# Patient Record
Sex: Male | Born: 1980
Health system: Southern US, Community
[De-identification: ages and names within clinical notes are randomized; demographics above are authoritative.]

## PROBLEM LIST (undated history)

## (undated) HISTORY — PX: INNER EAR SURGERY: SHX679

## (undated) HISTORY — PX: TYMPANOSTOMY TUBE PLACEMENT: SHX32

---

## 2015-09-17 ENCOUNTER — Emergency Department (INDEPENDENT_AMBULATORY_CARE_PROVIDER_SITE_OTHER)
Admission: EM | Admit: 2015-09-17 | Discharge: 2015-09-17 | Disposition: A | Discharge status: Home / Self Care | Payer: Managed Care, Other (non HMO) | Source: Home / Self Care | Attending: Family Medicine | Admitting: Family Medicine

## 2015-09-17 ENCOUNTER — Encounter: Payer: Self-pay | Admitting: Emergency Medicine

## 2015-09-17 DIAGNOSIS — J0120 Acute ethmoidal sinusitis, unspecified: Secondary | ICD-10-CM

## 2015-09-17 MED ORDER — AZITHROMYCIN 250 MG PO TABS: ORAL_TABLET | ORAL | Status: DC

## 2015-09-17 NOTE — ED Provider Notes (Signed)
CSN: [id number]     Arrival date & time 09/17/15  1352 History   First MD Initiated Contact with Patient 09/17/15 1405     Chief Complaint  Patient presents with  . Nose Problem      HPI Comments: Patient complains of soreness behind the bridge of his nose for about 5 days.  He has had mild nasal congestion but no cold symptoms, and feels well otherwise.  The history is provided by the patient.    History reviewed. No pertinent past medical history. History reviewed. No pertinent past surgical history. Family History  Problem Relation Age of Onset  . Parkinson's disease Father    Social History  Substance Use Topics  . Smoking status: Current Every Day Smoker -- 1.00 packs/day    Types: Cigarettes  . Smokeless tobacco: None  . Alcohol Use: Yes    Review of Systems No sore throat No cough No pleuritic pain No wheezing + nasal congestion ? post-nasal drainage + sinus pain/pressure No itchy/red eyes No earache No hemoptysis No SOB ? fever  No nausea No vomiting No abdominal pain No diarrhea No urinary symptoms No skin rash No fatigue No myalgias No headache Used OTC meds without relief  Allergies  Review of patient's allergies indicates no known allergies.  Home Medications   Prior to Admission medications   Medication Sig Start Date End Date Taking? Authorizing Provider  sodium chloride (OCEAN) 0.65 % SOLN nasal spray Place 1 spray into both nostrils as needed for congestion.   Yes Historical Provider, MD  azithromycin (ZITHROMAX Z-PAK) 250 MG tablet Take 2 tabs today; then begin one tab once daily for 4 more days. 09/17/15   Lattie Haw, MD   Meds Ordered and Administered this Visit  Medications - No data to display  BP 119/76 mmHg  Pulse 68  Temp(Src) 97.9 F (36.6 C) (Oral)  Ht  (1.702 m)  Wt 153 lb (69.4 kg)  BMI 23.96 kg/m2  SpO2 97% No data found.   Physical Exam Nursing notes and Vital Signs reviewed. Appearance:  Patient appears  stated age, and in no acute distress Eyes:  Pupils are equal, round, and reactive to light and accomodation.  Extraocular movement is intact.  Conjunctivae are not inflamed  Ears:  Canals normal.  Tympanic membranes normal.  Nose:  Mildly congested turbinates.  There is tenderness to palpation over the bridge of nose, although there is no erythema, swelling, or warmth.  Pharynx:  Normal Neck:  Supple.  No adenopathy Lungs:  Clear to auscultation.  Breath sounds are equal.  Moving air well. Heart:  Regular rate and rhythm without murmurs, rubs, or gallops.  Abdomen:  Nontender without masses or hepatosplenomegaly.  Bowel sounds are present.  No CVA or flank tenderness.  Extremities:  No edema.  Skin:  No rash present.   ED Course  Procedures  none  MDM   1. Acute ethmoidal sinusitis, recurrence not specified     Patient declines sinus films.  Begin Z-pack.  Take Pseudoephedrine ( , one or two every 4 to 6 hours) for sinus congestion.  Get adequate rest.   May use Afrin nasal spray (or generic oxymetazoline) twice daily for about 5 days and then discontinue.  Also recommend using saline nasal spray several times daily and saline nasal irrigation (AYR is a common brand).  Use Flonase nasal spray each morning after using Afrin nasal spray and saline nasal irrigation. Stop all antihistamines for now, and other non-prescription cough/cold  preparations.   Lattie HawStephen A Helen Winterhalter, MD 09/20/15 [phone]

## 2015-09-17 NOTE — Discharge Instructions (Signed)
Take Pseudoephedrine (30mg , one or two every 4 to 6 hours) for sinus congestion.  Get adequate rest.   May use Afrin nasal spray (or generic oxymetazoline) twice daily for about 5 days and then discontinue.  Also recommend using saline nasal spray several times daily and saline nasal irrigation (AYR is a common brand).  Use Flonase nasal spray each morning after using Afrin nasal spray and saline nasal irrigation. Stop all antihistamines for now, and other non-prescription cough/cold preparations.    Sinus Rinse WHAT IS A SINUS RINSE? A sinus rinse is a simple home treatment that is used to rinse your sinuses with a sterile mixture of salt and water (saline solution). Sinuses are air-filled spaces in your skull behind the bones of your face and forehead that open into your nasal cavity. You will use the following:  Saline solution.  Neti pot or spray bottle. This releases the saline solution into your nose and through your sinuses. Neti pots and spray bottles can be purchased at [facility] local pharmacy, a health food store, or online. WHEN WOULD I DO A SINUS RINSE? A sinus rinse can help to clear mucus, dirt, dust, or pollen from the nasal cavity. You may do a sinus rinse when you have a cold, a virus, nasal allergy symptoms, a sinus infection, or stuffiness in the nose or sinuses. If you are considering a sinus rinse:  Ask your child's health care provider before performing a sinus rinse on your child.  Do not do a sinus rinse if you have had ear or nasal surgery, ear infection, or blocked ears. HOW DO I DO A SINUS RINSE?  Wash your hands.  Disinfect your device according to the directions provided and then dry it.  Use the solution that comes with your device or one that is sold separately in stores. Follow the mixing directions on the package.  Fill your device with the amount of saline solution as directed by the device instructions.  Stand over a sink and tilt your head sideways over  the sink.  Place the spout of the device in your upper nostril (the one closer to the ceiling).  Gently pour or squeeze the saline solution into the nasal cavity. The liquid should drain to the lower nostril if you are not overly congested.  Gently blow your nose. Blowing too hard may cause ear pain.  Repeat in the other nostril.  Clean and rinse your device with clean water and then air-dry it. ARE THERE RISKS OF A SINUS RINSE?  Sinus rinse is generally very safe and effective. However, there are a few risks, which include:   A burning sensation in the sinuses. This may happen if you do not make the saline solution as directed. Make sure to follow all directions when making the saline solution.  Infection from contaminated water. This is rare, but possible.  Nasal irritation.   This information is not intended to replace advice given to you by your health care provider. Make sure you discuss any questions you have with your health care provider.   Document Released: 12/17/2013 Document Reviewed: 12/17/2013 Elsevier Interactive Patient Education [employer]2016 [employer] Inc.

## 2015-09-17 NOTE — ED Notes (Signed)
Pain in the bridge of nose x 6 days, slight runny nose, bridge of nose hurts to the touch. Used Saline spray and it burned. Slight pressure, like bit of headache one day.

## 2015-11-12 ENCOUNTER — Emergency Department: Admission: EM | Admit: 2015-11-12 | Discharge: 2015-11-12 | Discharge status: Absent without leave | Payer: Self-pay

## 2017-07-17 ENCOUNTER — Other Ambulatory Visit: Payer: Self-pay

## 2017-07-17 ENCOUNTER — Emergency Department (INDEPENDENT_AMBULATORY_CARE_PROVIDER_SITE_OTHER)
Admission: EM | Admit: 2017-07-17 | Discharge: 2017-07-17 | Disposition: A | Discharge status: Home / Self Care | Payer: Managed Care, Other (non HMO) | Source: Home / Self Care | Attending: Family Medicine | Admitting: Family Medicine

## 2017-07-17 DIAGNOSIS — J209 Acute bronchitis, unspecified: Secondary | ICD-10-CM | POA: Diagnosis not present

## 2017-07-17 LAB — POCT RAPID STREP A (OFFICE): RAPID STREP A SCREEN: NEGATIVE

## 2017-07-17 MED ORDER — AZITHROMYCIN 250 MG PO TABS: ORAL_TABLET | ORAL | 0 refills | Status: DC

## 2017-07-17 NOTE — Discharge Instructions (Signed)
Take plain guaifenesin (600mg  or 1200mg  extended release tabs such as Mucinex) twice daily, with plenty of water, for cough and congestion.  May add Pseudoephedrine (30mg , one or two every 4 to 6 hours) for sinus congestion.  Get adequate rest.   May use Afrin nasal spray (or generic oxymetazoline) each morning for about 5 days and then discontinue.  Also recommend using saline nasal spray several times daily and saline nasal irrigation (AYR is a common brand).   Try warm salt water gargles for sore throat.  Stop all antihistamines for now, and other non-prescription cough/cold preparations. May take Ibuprofen 200mg , 4 tabs every 8 hours with food for body aches, headache, etc.. May take Delsym Cough Suppressant at bedtime for nighttime cough.

## 2017-07-17 NOTE — ED Provider Notes (Signed)
[facility]    CSN: [id number] Arrival date & time: 07/17/17  1318     History   Chief Complaint Chief Complaint  Patient presents with  . Sore Throat  . Generalized Body Aches  . Chills    HPI Grant Gilbert is a 37 y.o. male.   Patient reports that he had a flu-like illness two weeks ago and improved, but has a persistent cough.  Yesterday he developed sore throat, chills, fatigue, myalgias, fever to 99.5, and increased sinus congestion.  He denies pleuritic pain but often coughs until he gags.  He states that his daughter has had a strep infection.   The history is provided by the patient.    History reviewed. No pertinent past medical history.  There are no active problems to display for this patient.   History reviewed. No pertinent surgical history.     Home Medications    Prior to Admission medications   Medication Sig Start Date End Date Taking? Authorizing Provider  azithromycin (ZITHROMAX Z-PAK) 250 MG tablet Take 2 tabs today; then begin one tab once daily for 4 more days. 07/17/17   Lattie HawBeese, Hinley Brimage A, MD  sodium chloride (OCEAN) 0.65 % SOLN nasal spray Place 1 spray into both nostrils as needed for congestion.    [provider]    Family History Family History  Problem Relation Age of Onset  . Parkinson's disease Father     Social History Social History   Tobacco Use  . Smoking status: Current Every Day Smoker    Packs/day: 1.00    Types: Cigarettes  . Smokeless tobacco: Never Used  Substance Use Topics  . Alcohol use: Yes  . Drug use: No     Allergies   Patient has no known allergies.   Review of Systems Review of Systems No sore throat + cough No pleuritic pain No wheezing + nasal congestion + post-nasal drainage No sinus pain/pressure No itchy/red eyes No earache No hemoptysis No SOB + fever, + chills No nausea No vomiting No abdominal pain No diarrhea No urinary symptoms No skin rash + fatigue +  myalgias No headache Used OTC meds without relief   Physical Exam Triage Vital Signs ED Triage Vitals  Enc Vitals Group     BP 07/17/17 1519 135/80     Pulse Rate 07/17/17 1519 90     Resp --      Temp 07/17/17 1519 98.5 F (36.9 C)     Temp Source 07/17/17 1519 Oral     SpO2 07/17/17 1519 96 %     Weight 07/17/17 1521 167 lb (75.8 kg)     Height 07/17/17 1521 5\' 7"  (1.702 m)     Head Circumference --      Peak Flow --      Pain Score 07/17/17 1520 0     Pain Loc --      Pain Edu? --      Excl. in GC? --    No data found.  Updated Vital Signs BP 135/80 (BP Location: Right Arm)   Pulse 90   Temp 98.5 F (36.9 C) (Oral)   Ht 5\' 7"  (1.702 m)   Wt 167 lb (75.8 kg)   SpO2 96%   BMI 26.16 kg/m   Visual Acuity Right Eye Distance:   Left Eye Distance:   Bilateral Distance:    Right Eye Near:   Left Eye Near:    Bilateral Near:     Physical Exam  Nursing notes and Vital Signs reviewed. Appearance:  Patient appears stated age, and in no acute distress Eyes:  Pupils are equal, round, and reactive to light and accomodation.  Extraocular movement is intact.  Conjunctivae are not inflamed  Ears:  Canals normal.  Tympanic membranes normal.  Nose:  Mildly congested turbinates.  No sinus tenderness.  Pharynx:  Mildly erythematous. Neck:  Supple.  No adenopathy.   Lungs:  Clear to auscultation.  Breath sounds are equal.  Moving air well. Heart:  Regular rate and rhythm without murmurs, rubs, or gallops.  Abdomen:  Nontender without masses or hepatosplenomegaly.  Bowel sounds are present.  No CVA or flank tenderness.  Extremities:  No edema.  Skin:  No rash present.    UC Treatments / Results  Labs (all labs ordered are listed, but only abnormal results are displayed) Labs Reviewed  POCT RAPID STREP A (OFFICE) negative    EKG  EKG Interpretation None       Radiology No results found.  Procedures Procedures (including critical care time)  Medications Ordered  in UC Medications - No data to display   Initial Impression / Assessment and Plan / UC Course  I have reviewed the triage vital signs and the nursing notes.  Pertinent labs & imaging results that were available during my care of the patient were reviewed by me and considered in my medical decision making (see chart for details).    Begin Z-pak for atypical coverage. Take plain guaifenesin (600mg  or 1200mg  extended release tabs such as Mucinex) twice daily, with plenty of water, for cough and congestion.  May add Pseudoephedrine (30mg , one or two every 4 to 6 hours) for sinus congestion.  Get adequate rest.   May use Afrin nasal spray (or generic oxymetazoline) each morning for about 5 days and then discontinue.  Also recommend using saline nasal spray several times daily and saline nasal irrigation (AYR is a common brand).   Try warm salt water gargles for sore throat.  Stop all antihistamines for now, and other non-prescription cough/cold preparations. May take Ibuprofen 200mg , 4 tabs every 8 hours with food for body aches, headache, etc.. May take Delsym Cough Suppressant at bedtime for nighttime cough.  Followup with Family Doctor if not improved in one week.     Final Clinical Impressions(s) / UC Diagnoses   Final diagnoses:  Acute bronchitis, unspecified organism    ED Discharge Orders        Ordered    azithromycin (ZITHROMAX Z-PAK) 250 MG tablet     07/17/17 1540           Lattie Haw, MD 07/28/17 1129

## 2017-07-17 NOTE — ED Triage Notes (Signed)
Daughter was sick with strep last week, has had sore throat, chills and generalized aching.

## 2017-09-06 ENCOUNTER — Other Ambulatory Visit: Payer: Self-pay

## 2017-09-06 ENCOUNTER — Emergency Department (INDEPENDENT_AMBULATORY_CARE_PROVIDER_SITE_OTHER)
Admission: EM | Admit: 2017-09-06 | Discharge: 2017-09-06 | Disposition: A | Discharge status: Home / Self Care | Payer: Managed Care, Other (non HMO) | Source: Home / Self Care | Attending: Family Medicine | Admitting: Family Medicine

## 2017-09-06 DIAGNOSIS — J069 Acute upper respiratory infection, unspecified: Secondary | ICD-10-CM

## 2017-09-06 DIAGNOSIS — Z23 Encounter for immunization: Secondary | ICD-10-CM | POA: Diagnosis not present

## 2017-09-06 DIAGNOSIS — B9789 Other viral agents as the cause of diseases classified elsewhere: Secondary | ICD-10-CM

## 2017-09-06 MED ORDER — TETANUS-DIPHTH-ACELL PERTUSSIS 5-2.5-18.5 LF-MCG/0.5 IM SUSP
0.5000 mL | Freq: Once | INTRAMUSCULAR | Status: AC
  Administered 2017-09-06: 0.5 mL via INTRAMUSCULAR

## 2017-09-06 MED ORDER — TETANUS-DIPHTH-ACELL PERTUSSIS 5-2.5-18.5 LF-MCG/0.5 IM SUSP: 0.5000 mL | Freq: Once | INTRAMUSCULAR | Status: DC

## 2017-09-06 MED ORDER — DOXYCYCLINE HYCLATE 100 MG PO CAPS: 100.0000 mg | ORAL_CAPSULE | Freq: Two times a day (BID) | ORAL | 0 refills | Status: DC

## 2017-09-06 NOTE — Discharge Instructions (Addendum)
Take plain guaifenesin (1200mg  extended release tabs such as Mucinex) twice daily, with plenty of water, for cough and congestion.  May add Pseudoephedrine (30mg , one or two every 4 to 6 hours) for sinus congestion.  Get adequate rest.   May use Afrin nasal spray (or generic oxymetazoline) each morning for about 5 days and then discontinue.  Also recommend using saline nasal spray several times daily and saline nasal irrigation (AYR is a common brand).  Use Flonase nasal spray each morning after using Afrin nasal spray and saline nasal irrigation. Try warm salt water gargles for sore throat.  Stop all antihistamines for now, and other non-prescription cough/cold preparations. May take Ibuprofen 200mg , 4 tabs every 8 hours with food for body aches, headache, etc. May take Delsym Cough Suppressant at bedtime for nighttime cough.  Begin Doxycycline if not improving about one week or if persistent fever develops

## 2017-09-06 NOTE — ED Provider Notes (Signed)
[name]    CSN: [id number] Arrival date & time: 09/06/17  1546     History   Chief Complaint Chief Complaint  Patient presents with  . URI    HPI Grant Gilbert is a 37 y.o. male.   Patient reports that he has had a mild cough for about 10 days.  Four days ago he developed increased sinus congestion and right facial pressure, fatigue, and fever to 100 with sweats.  No pleuritic pain or shortness of breath.  No sore throat. He also states that he had a small laceration to his right thumb five days ago, now healed.  He requests a Tdap.  The history is provided by the patient.    No past medical history on file.  There are no active problems to display for this patient.   No past surgical history on file.     Home Medications    Prior to Admission medications   Medication Sig Start Date End Date Taking? Authorizing Provider  doxycycline (VIBRAMYCIN) 100 MG capsule Take 1 capsule (100 mg total) by mouth 2 (two) times daily. Take with food (Rx void after 09/14/17) 09/06/17   Lattie Haw, MD  sodium chloride (OCEAN) 0.65 % SOLN nasal spray Place 1 spray into both nostrils as needed for congestion.    [provider]    Family History Family History  Problem Relation Age of Onset  . Parkinson's disease Father     Social History Social History   Tobacco Use  . Smoking status: Current Every Day Smoker    Packs/day: 0.50    Types: Cigarettes  . Smokeless tobacco: Never Used  Substance Use Topics  . Alcohol use: Yes    Alcohol/week: 1.8 oz    Types: 3 Cans of beer per week  . Drug use: No     Allergies   Patient has no known allergies.   Review of Systems Review of Systems No sore throat + cough No pleuritic pain No wheezing + nasal congestion + post-nasal drainage + sinus pain/pressure No itchy/red eyes No earache No hemoptysis No SOB + fever, + chills No nausea No vomiting No abdominal pain No diarrhea No urinary  symptoms No skin rash + fatigue No myalgias + headache Used OTC meds without relief   Physical Exam Triage Vital Signs ED Triage Vitals  Enc Vitals Group     BP 09/06/17 1602 121/76     Pulse Rate 09/06/17 1602 78     Resp --      Temp 09/06/17 1602 98.9 F (37.2 C)     Temp Source 09/06/17 1602 Oral     SpO2 09/06/17 1602 99 %     Weight 09/06/17 1603 165 lb (74.8 kg)     Height 09/06/17 1603 5\' 7"  (1.702 m)     Head Circumference --      Peak Flow --      Pain Score 09/06/17 1602 3     Pain Loc --      Pain Edu? --      Excl. in GC? --    No data found.  Updated Vital Signs BP 121/76 (BP Location: Right Arm)   Pulse 78   Temp 98.9 F (37.2 C) (Oral)   Ht 5\' 7"  (1.702 m)   Wt 165 lb (74.8 kg)   SpO2 99%   BMI 25.84 kg/m   Visual Acuity Right Eye Distance:   Left Eye Distance:   Bilateral Distance:  Right Eye Near:   Left Eye Near:    Bilateral Near:     Physical Exam Nursing notes and Vital Signs reviewed. Appearance:  Patient appears stated age, and in no acute distress Eyes:  Pupils are equal, round, and reactive to light and accomodation.  Extraocular movement is intact.  Conjunctivae are not inflamed  Ears:  Canals normal.  Tympanic membranes normal.  Nose:  Congested turbinates.  No sinus tenderness.  Pharynx:  Normal Neck:  Supple.  Enlarged posterior/lateral nodes are palpated bilaterally, tender to palpation on the left.   Lungs:  Clear to auscultation.  Breath sounds are equal.  Moving air well. Heart:  Regular rate and rhythm without murmurs, rubs, or gallops.  Abdomen:  Nontender without masses or hepatosplenomegaly.  Bowel sounds are present.  No CVA or flank tenderness.  Extremities:  No edema.  Skin:  No rash; small healed laceration on right thumb.   UC Treatments / Results  Labs (all labs ordered are listed, but only abnormal results are displayed) Labs Reviewed - No data to display  EKG None Radiology No results  found.  Procedures Procedures (including critical care time)  Medications Ordered in UC Medications  Tdap (BOOSTRIX) injection 0.5 mL (has no administration in time range)     Initial Impression / Assessment and Plan / UC Course  I have reviewed the triage vital signs and the nursing notes.  Pertinent labs & imaging results that were available during my care of the patient were reviewed by me and considered in my medical decision making (see chart for details).    Administered Tdap  There is no evidence of bacterial infection today.   Treat symptomatically for now  Take plain guaifenesin (1200mg  extended release tabs such as Mucinex) twice daily, with plenty of water, for cough and congestion.  May add Pseudoephedrine (30mg , one or two every 4 to 6 hours) for sinus congestion.  Get adequate rest.   May use Afrin nasal spray (or generic oxymetazoline) each morning for about 5 days and then discontinue.  Also recommend using saline nasal spray several times daily and saline nasal irrigation (AYR is a common brand).  Use Flonase nasal spray each morning after using Afrin nasal spray and saline nasal irrigation. Try warm salt water gargles for sore throat.  Stop all antihistamines for now, and other non-prescription cough/cold preparations. May take Ibuprofen 200mg , 4 tabs every 8 hours with food for body aches, headache, etc. May take Delsym Cough Suppressant at bedtime for nighttime cough.  Begin Doxycycline if not improving about one week or if persistent fever develops (Given a prescription to hold, with an expiration date)  Followup with Family Doctor if not improved in about 10 days.    Final Clinical Impressions(s) / UC Diagnoses   Final diagnoses:  Viral URI with cough    ED Discharge Orders        Ordered    doxycycline (VIBRAMYCIN) 100 MG capsule  2 times daily     09/06/17 1626          Lattie HawBeese, Quadarius Henton A, MD 09/06/17 1640

## 2017-09-06 NOTE — ED Triage Notes (Signed)
Pt c/o of one week history of brown nasal drainage, productive cough, and sinus pressure above and behind right eye.  Pt states that he has had fever, highest being 100.0.  Pt also states that he fell off of a ladder 09/01/17 and lacerated his right thumb.  Pt does not recall last Tdap and is requesting this be updated today.  Tiajuana Amass. Nelson, CMA

## 2018-08-22 ENCOUNTER — Other Ambulatory Visit: Payer: Self-pay

## 2018-08-22 ENCOUNTER — Emergency Department (INDEPENDENT_AMBULATORY_CARE_PROVIDER_SITE_OTHER)
Admission: EM | Admit: 2018-08-22 | Discharge: 2018-08-22 | Disposition: A | Discharge status: Home / Self Care | Payer: BLUE CROSS/BLUE SHIELD | Source: Home / Self Care

## 2018-08-22 DIAGNOSIS — R69 Illness, unspecified: Secondary | ICD-10-CM | POA: Diagnosis not present

## 2018-08-22 DIAGNOSIS — J111 Influenza due to unidentified influenza virus with other respiratory manifestations: Secondary | ICD-10-CM

## 2018-08-22 LAB — POCT INFLUENZA A/B
Influenza A, POC: NEGATIVE
Influenza B, POC: NEGATIVE

## 2018-08-22 NOTE — Discharge Instructions (Signed)
  You may take 500mg acetaminophen every 4-6 hours or in combination with ibuprofen 400-600mg every 6-8 hours as needed for pain, inflammation, and fever.  Be sure to well hydrated with clear liquids and get at least 8 hours of sleep at night, preferably more while sick.   Please follow up with family medicine in 1 week if needed.   

## 2018-08-22 NOTE — ED Triage Notes (Signed)
Mr. Taitano has been experiencing fevers, chills, body aches, fatigue and coughing x3 days. He has taken otc cold medication. At home his body temp ranges 100.10F- 100.6F. He went to work this am despite feeling unwell, denies being around anyone who has been sick, or traveled. He is not currently febrile at this time.

## 2018-08-22 NOTE — ED Provider Notes (Signed)
[name]    CSN: [id number] Arrival date & time: 08/22/18  1302     History   Chief Complaint Chief Complaint  Patient presents with  . Fever  . Fatigue  . Generalized Body Aches  . Cough    HPI Grant Gilbert is a 38 y.o. male.   HPI Grant Gilbert is a 38 y.o. male presenting to UC with c/o 3 days (started 08/19/2018) of gradually improving body aches, fever Tmax 101*F, chills, mild cough congestion and fatigue.  He has taken OTC cold medication including Alka-selter plus with moderate relief.  This morning he had a temp up to 100.5*F. last dose of medication was around 6:45AM. He went to work after missing the last 2 days but he was encouraged to get evaluated before returning to work. Pt notes his young daughter had a runny nose recently but she goes to daycare, he assumed it was a mild cold. She is better now. No recent travel.     History reviewed. No pertinent past medical history.  There are no active problems to display for this patient.   History reviewed. No pertinent surgical history.     Home Medications    Prior to Admission medications   Medication Sig Start Date End Date Taking? Authorizing Provider  doxycycline (VIBRAMYCIN) 100 MG capsule Take 1 capsule (100 mg total) by mouth 2 (two) times daily. Take with food (Rx void after 09/14/17) 09/06/17   Lattie Haw, MD  sodium chloride (OCEAN) 0.65 % SOLN nasal spray Place 1 spray into both nostrils as needed for congestion.    [provider]    Family History Family History  Problem Relation Age of Onset  . Parkinson's disease Father     Social History Social History   Tobacco Use  . Smoking status: Current Every Day Smoker    Packs/day: 0.50    Types: Cigarettes  . Smokeless tobacco: Never Used  Substance Use Topics  . Alcohol use: Yes    Alcohol/week: 3.0 standard drinks    Types: 3 Cans of beer per week  . Drug use: No     Allergies   Patient has no known  allergies.   Review of Systems Review of Systems  Constitutional: Positive for chills, fatigue and fever.  HENT: Positive for congestion and rhinorrhea. Negative for ear pain, sore throat, trouble swallowing and voice change.   Respiratory: Positive for cough. Negative for shortness of breath.   Cardiovascular: Negative for chest pain and palpitations.  Gastrointestinal: Negative for abdominal pain, diarrhea, nausea and vomiting.  Musculoskeletal: Positive for arthralgias and myalgias. Negative for back pain.  Skin: Negative for rash.     Physical Exam Triage Vital Signs ED Triage Vitals  Enc Vitals Group     BP 08/22/18 1344 128/85     Pulse Rate 08/22/18 1344 70     Resp 08/22/18 1344 17     Temp 08/22/18 1344 98.6 F (37 C)     Temp Source 08/22/18 1344 Oral     SpO2 08/22/18 1344 99 %     Weight 08/22/18 1345 161 lb (73 kg)     Height 08/22/18 1345 5\' 7"  (1.702 m)     Head Circumference --      Peak Flow --      Pain Score 08/22/18 1345 0     Pain Loc --      Pain Edu? --      Excl. in GC? --  No data found.  Updated Vital Signs BP 128/85 (BP Location: Right Arm)   Pulse 70   Temp 98.6 F (37 C) (Oral)   Resp 17   Ht 5\' 7"  (1.702 m)   Wt 161 lb (73 kg)   SpO2 99%   BMI 25.22 kg/m   Visual Acuity Right Eye Distance:   Left Eye Distance:   Bilateral Distance:    Right Eye Near:   Left Eye Near:    Bilateral Near:     Physical Exam Vitals signs and nursing note reviewed.  Constitutional:      Appearance: Normal appearance. He is well-developed.  HENT:     Head: Normocephalic and atraumatic.     Right Ear: Tympanic membrane normal.     Left Ear: Tympanic membrane normal.     Nose: Nose normal.     Right Sinus: No maxillary sinus tenderness or frontal sinus tenderness.     Left Sinus: No maxillary sinus tenderness or frontal sinus tenderness.     Mouth/Throat:     Lips: Pink.     Mouth: Mucous membranes are moist.     Pharynx: Oropharynx is  clear. Uvula midline.  Neck:     Musculoskeletal: Normal range of motion.  Cardiovascular:     Rate and Rhythm: Normal rate and regular rhythm.  Pulmonary:     Effort: Pulmonary effort is normal. No respiratory distress.     Breath sounds: Normal breath sounds. No stridor. No wheezing or rhonchi.  Musculoskeletal: Normal range of motion.  Skin:    General: Skin is warm and dry.  Neurological:     Mental Status: He is alert and oriented to person, place, and time.  Psychiatric:        Behavior: Behavior normal.      UC Treatments / Results  Labs (all labs ordered are listed, but only abnormal results are displayed) Labs Reviewed  POCT INFLUENZA A/B - Normal    EKG None  Radiology No results found.  Procedures Procedures (including critical care time)  Medications Ordered in UC Medications - No data to display  Initial Impression / Assessment and Plan / UC Course  I have reviewed the triage vital signs and the nursing notes.  Pertinent labs & imaging results that were available during my care of the patient were reviewed by me and considered in my medical decision making (see chart for details).     Rapid flu: NEGATIVE No evidence of underlying bacterial infection at this time. Does not meet criteria at this time for Covid 19 testing.  Pt notes he is not scheduled to return to work until Monday 08/26/2018  Final Clinical Impressions(s) / UC Diagnoses   Final diagnoses:  Influenza-like illness     Discharge Instructions      You may take 500mg  acetaminophen every 4-6 hours or in combination with ibuprofen 400-600mg  every 6-8 hours as needed for pain, inflammation, and fever.  Be sure to well hydrated with clear liquids and get at least 8 hours of sleep at night, preferably more while sick.   Please follow up with family medicine in 1 week if needed.     ED Prescriptions    None     Controlled Substance Prescriptions [place] Controlled Substance  Registry consulted? Not Applicable   Rolla Plate 08/22/18 1434

## 2019-03-30 ENCOUNTER — Emergency Department (INDEPENDENT_AMBULATORY_CARE_PROVIDER_SITE_OTHER): Payer: BC Managed Care – PPO

## 2019-03-30 ENCOUNTER — Other Ambulatory Visit: Payer: Self-pay

## 2019-03-30 ENCOUNTER — Emergency Department (INDEPENDENT_AMBULATORY_CARE_PROVIDER_SITE_OTHER)
Admission: EM | Admit: 2019-03-30 | Discharge: 2019-03-30 | Disposition: A | Discharge status: Home / Self Care | Payer: BC Managed Care – PPO | Source: Home / Self Care | Attending: Family Medicine | Admitting: Family Medicine

## 2019-03-30 DIAGNOSIS — M25511 Pain in right shoulder: Secondary | ICD-10-CM

## 2019-03-30 DIAGNOSIS — M542 Cervicalgia: Secondary | ICD-10-CM | POA: Diagnosis not present

## 2019-03-30 DIAGNOSIS — S0501XA Injury of conjunctiva and corneal abrasion without foreign body, right eye, initial encounter: Secondary | ICD-10-CM
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
## 2019-03-30 NOTE — Discharge Instructions (Addendum)
May apply lubricating eye drops to right eye as needed.  Apply ice pack to right shoulder for 20 to 30 minutes, 2 to 3 times daily  Continue until pain decreases.  May take Ibuprofen 200mg , 4 tabs every 8 hours with food.  Begin range of motion and stretching exercises as tolerated.

## 2019-03-30 NOTE — ED Provider Notes (Signed)
[name]    CSN: [id number] Arrival date & time: 03/30/19  1506      History   Chief Complaint Chief Complaint  Patient presents with  . Shoulder Pain  . Eye Pain    RT    HPI Grant Gilbert is a 38 y.o. male.   Patient presents with two complaints: 1)  He has been having intermittent pain in his right shoulder and neck area for about 3 months.  The pain often seems to start in his right deltoid area, radiating to his posterior/lateral neck and upper right chest.  The pain has become more prominent recently.  He recalls no injuries.  He points to his distal acromion as a source of pain. 2)  Last night while breaking up twigs and branches, a small twig scratched his right eye.  His right eye was mildly irritated last night, but now has minimal foreign body sensation.  He denies change in vision and minimal foreign body sensation.  The history is provided by the patient.  Shoulder Pain Location:  Shoulder Shoulder location:  R shoulder Injury: no   Pain details:    Quality:  Aching   Radiates to: right neck and right upper anterior chest.   Severity:  Mild   Onset quality:  Gradual   Duration:  3 months   Timing:  Constant   Progression:  Worsening Handedness:  Right-handed Prior injury to area:  No Relieved by:  Nothing Worsened by:  Movement Ineffective treatments:  NSAIDs Associated symptoms: neck pain   Associated symptoms: no back pain, no decreased range of motion, no muscle weakness, no numbness, no stiffness, no swelling and no tingling   Eye Pain    History reviewed. No pertinent past medical history.  There are no active problems to display for this patient.   History reviewed. No pertinent surgical history.     Home Medications    Prior to Admission medications   Medication Sig Start Date End Date Taking? Authorizing Provider  doxycycline (VIBRAMYCIN) 100 MG capsule Take 1 capsule (100 mg total) by mouth 2 (two) times daily. Take  with food (Rx void after 09/14/17) 09/06/17   Kandra Nicolas, MD  sodium chloride (OCEAN) 0.65 % SOLN nasal spray Place 1 spray into both nostrils as needed for congestion.    [provider]    Family History Family History  Problem Relation Age of Onset  . Parkinson's disease Father     Social History Social History   Tobacco Use  . Smoking status: Current Every Day Smoker    Packs/day: 0.50    Types: Cigarettes  . Smokeless tobacco: Never Used  Substance Use Topics  . Alcohol use: Yes    Alcohol/week: 3.0 standard drinks    Types: 3 Cans of beer per week  . Drug use: No     Allergies   Patient has no known allergies.   Review of Systems Review of Systems  Eyes: Positive for pain and redness. Negative for photophobia, discharge and visual disturbance.  Musculoskeletal: Positive for neck pain. Negative for back pain and stiffness.       Right shoulder pain  All other systems reviewed and are negative.    Physical Exam Triage Vital Signs ED Triage Vitals [03/30/19 1523]  Enc Vitals Group     BP (!) 141/90     Pulse Rate 77     Resp 18     Temp 98.6 F (37 C)  Temp Source Oral     SpO2 98 %     Weight 173 lb (78.5 kg)     Height 5\' 7"  (1.702 m)     Head Circumference      Peak Flow      Pain Score 5     Pain Loc      Pain Edu?      Excl. in GC?    No data found.  Updated Vital Signs BP (!) 141/90 (BP Location: Right Arm)   Pulse 77   Temp 98.6 F (37 C) (Oral)   Resp 18   Ht 5\' 7"  (1.702 m)   Wt 78.5 kg   SpO2 98%   BMI 27.10 kg/m   Visual Acuity Right Eye Distance: 20/13 Left Eye Distance: 20/50 Bilateral Distance: 20/13  Right Eye Near:   Left Eye Near:    Bilateral Near:     Physical Exam Vitals signs and nursing note reviewed.  Constitutional:      General: He is not in acute distress. HENT:     Head: Normocephalic.     Nose: Nose normal.     Mouth/Throat:     Pharynx: Oropharynx is clear.  Eyes:     General:  Lids are normal. Lids are everted, no foreign bodies appreciated.        Right eye: No foreign body, discharge or hordeolum.     Extraocular Movements: Extraocular movements intact.     Pupils: Pupils are equal, round, and reactive to light.      Comments: Right conjunctivae has a small minimal area of erythema about 2mm diameter just lateral to cornea as noted on diagram.  Right cornea has no fluorescein uptake.  Neck:     Musculoskeletal: Normal range of motion.      Comments: Patient reports pain in right shoulder and neck area as noted on diagram but there is no tenderness to palpation in these areas..  Cardiovascular:     Heart sounds: Normal heart sounds.  Pulmonary:     Breath sounds: Normal breath sounds.  Musculoskeletal:     Right shoulder: He exhibits normal range of motion, no tenderness, no bony tenderness, no swelling, no crepitus and no deformity.     Comments:  Right shoulder has full range of motion. There is no tenderness to palpation including over the sub-acromial bursa and biceps tendon insertions.  Apley's and empty can tests negative.    Skin:    General: Skin is warm and dry.  Neurological:     Mental Status: He is alert.      UC Treatments / Results  Labs (all labs ordered are listed, but only abnormal results are displayed) Labs Reviewed - No data to display  EKG   Radiology Dg Cervical Spine Complete  Result Date: 03/30/2019 CLINICAL DATA:  Right shoulder and neck pain since August EXAM: CERVICAL SPINE - COMPLETE 4+ VIEW COMPARISON:  None. FINDINGS: Preservation of the normal cervical lordosis without traumatic listhesis. No abnormal facet widening. Normal alignment of the craniocervical and atlantoaxial articulations. No acute fracture. No primary bone lesion or focal pathologic process. No significant spinal canal or foraminal stenosis. No pre or paravertebral soft tissue abnormality. Airways patent. Upper lungs are unremarkable. IMPRESSION:  Unremarkable appearance of the cervical spine. No significant bony spinal canal or foraminal stenosis. No acute osseous abnormality. Electronically Signed   By: Kreg ShropshirePrice  DeHay M.D.   On: 03/30/2019 17:28    Procedures Procedures (including critical care time)  Medications Ordered in UC Medications - No data to display  Initial Impression / Assessment and Plan / UC Course  I have reviewed the triage vital signs and the nursing notes.  Pertinent labs & imaging results that were available during my care of the patient were reviewed by me and considered in my medical decision making (see chart for details).    Negative C-spine films reassuring, but etiology of shoulder pain not obvious.  Given the location that he experiences pain, it may be a subacromial bursitis. Recommend follow-up with Dr. Rodney Langton for further evaluation.   Final Clinical Impressions(s) / UC Diagnoses   Final diagnoses:  Pain in joint of right shoulder  Conjunctival abrasion, right, initial encounter     Discharge Instructions     May apply lubricating eye drops to right eye as needed.  Apply ice pack to right shoulder for 20 to 30 minutes, 2 to 3 times daily  Continue until pain decreases.  May take Ibuprofen 200mg , 4 tabs every 8 hours with food.  Begin range of motion and stretching exercises as tolerated.    ED Prescriptions    None        , MD 03/30/19 [phone]

## 2019-08-08 ENCOUNTER — Other Ambulatory Visit: Payer: Self-pay

## 2019-08-08 ENCOUNTER — Ambulatory Visit (INDEPENDENT_AMBULATORY_CARE_PROVIDER_SITE_OTHER): Payer: BC Managed Care – PPO | Admitting: Sports Medicine

## 2019-08-08 ENCOUNTER — Encounter: Payer: Self-pay | Admitting: Sports Medicine

## 2019-08-08 ENCOUNTER — Ambulatory Visit (INDEPENDENT_AMBULATORY_CARE_PROVIDER_SITE_OTHER): Payer: BC Managed Care – PPO

## 2019-08-08 DIAGNOSIS — G8929 Other chronic pain: Secondary | ICD-10-CM | POA: Diagnosis not present

## 2019-08-08 DIAGNOSIS — M545 Low back pain: Secondary | ICD-10-CM | POA: Diagnosis not present

## 2019-08-08 DIAGNOSIS — M25511 Pain in right shoulder: Secondary | ICD-10-CM | POA: Diagnosis not present

## 2019-08-08 DIAGNOSIS — S300XXA Contusion of lower back and pelvis, initial encounter: Secondary | ICD-10-CM

## 2019-08-08 DIAGNOSIS — M25552 Pain in left hip: Secondary | ICD-10-CM | POA: Diagnosis not present

## 2019-08-08 MED ORDER — PREDNISONE 50 MG PO TABS
ORAL_TABLET | ORAL | 0 refills | Status: DC
Start: 2019-08-08 — End: 2019-08-26

## 2019-08-08 NOTE — Assessment & Plan Note (Signed)
2 weeks ago this pleasant 39 year old male fell down the steps, he fell directly onto his left buttock, he felt a sharp pain down his leg to his foot. Now he has a lump on his buttocks. I think he has a intramuscular versus subcutaneous hematoma. I think also he suffered a neuropraxia of his sciatic nerve on the left. We will start conservatively, x-rays of his pelvis and lumbar spine, 5 days of prednisone. If insufficient provement we will do an ultrasound looking for intramuscular versus subcutaneous hematoma and treat it interventionally.

## 2019-08-08 NOTE — Assessment & Plan Note (Signed)
Grant Gilbert has chronic right shoulder pain with mechanical symptoms and popping. His exam was for the most part unremarkable with the exception of a positive O'Brien's test suspicious for a labral injury. We will start conservatively, x-rays, labral rehab exercises, return to see me in a couple weeks, we will likely need to proceed with an MR arthrogram if no better.

## 2019-08-08 NOTE — Progress Notes (Signed)
    Procedures performed today:    None.  Independent interpretation of notes and tests performed by another provider:   None.  Impression and Recommendations:    Contusion, buttock, left 2 weeks ago this pleasant 39 year old male fell down the steps, he fell directly onto his left buttock, he felt a sharp pain down his leg to his foot. Now he has a lump on his buttocks. I think he has a intramuscular versus subcutaneous hematoma. I think also he suffered a neuropraxia of his sciatic nerve on the left. We will start conservatively, x-rays of his pelvis and lumbar spine, 5 days of prednisone. If insufficient provement we will do an ultrasound looking for intramuscular versus subcutaneous hematoma and treat it interventionally.  Chronic right shoulder pain [place] has chronic right shoulder pain with mechanical symptoms and popping. His exam was for the most part unremarkable with the exception of a positive O'Brien's test suspicious for a labral injury. We will start conservatively, x-rays, labral rehab exercises, return to see me in a couple weeks, we will likely need to proceed with an MR arthrogram if no better.    ___________________________________________ Ihor Austin. Benjamin Stain, M.D., ABFM., CAQSM. Primary Care and Sports Medicine [place] [facility]  Adjunct Instructor of Family Medicine  [employer]

## 2019-08-22 ENCOUNTER — Ambulatory Visit: Payer: BC Managed Care – PPO | Admitting: Sports Medicine

## 2019-08-26 ENCOUNTER — Encounter: Payer: Self-pay | Admitting: Sports Medicine

## 2019-08-26 ENCOUNTER — Ambulatory Visit (INDEPENDENT_AMBULATORY_CARE_PROVIDER_SITE_OTHER): Payer: BC Managed Care – PPO | Admitting: Sports Medicine

## 2019-08-26 ENCOUNTER — Other Ambulatory Visit: Payer: Self-pay

## 2019-08-26 DIAGNOSIS — M25511 Pain in right shoulder: Secondary | ICD-10-CM

## 2019-08-26 DIAGNOSIS — G8929 Other chronic pain: Secondary | ICD-10-CM

## 2019-08-26 DIAGNOSIS — S300XXD Contusion of lower back and pelvis, subsequent encounter: Secondary | ICD-10-CM | POA: Diagnosis not present

## 2019-08-26 NOTE — Assessment & Plan Note (Signed)
Grant Gilbert continues to have chronic right shoulder pain, he had a positive O'Brien's test at the last visit suspicious for a labral tear, we added some labral rehab exercises, he is feeling better but not completely perfect. Overall he does not feel as though his pain warrants an MRI so we will just watch this for now.

## 2019-08-26 NOTE — Assessment & Plan Note (Signed)
Grant Gilbert returns, he continues to have a fairly large appearing contusion with fullness over his buttock on the left. On ultrasound there does appear to be a small subcutaneous seroma. This was injected and he will return to see me in 1 month.

## 2019-08-26 NOTE — Progress Notes (Signed)
    Procedures performed today:    Procedure: Real-time Ultrasound Guided injection of the left buttock subcutaneous seroma Device: Samsung HS60  Verbal informed consent obtained.  Time-out conducted.  Noted no overlying erythema, induration, or other signs of local infection.  Skin prepped in a sterile fashion.  Local anesthesia: Topical Ethyl chloride.  With sterile technique and under real time ultrasound guidance: 1 cc Kenalog 40, 2 cc lidocaine, 2 cc bupivacaine injected easily Completed without difficulty  Pain immediately resolved suggesting accurate placement of the medication.  Advised to call if fevers/chills, erythema, induration, drainage, or persistent bleeding.  Images permanently stored and available for review in the ultrasound unit.  Impression: Technically successful ultrasound guided injection.  Independent interpretation of notes and tests performed by another provider:   None.  Impression and Recommendations:    Contusion, buttock, left Papa returns, he continues to have a fairly large appearing contusion with fullness over his buttock on the left. On ultrasound there does appear to be a small subcutaneous seroma. This was injected and he will return to see me in 1 month.  Chronic right shoulder pain [place] continues to have chronic right shoulder pain, he had a positive O'Brien's test at the last visit suspicious for a labral tear, we added some labral rehab exercises, he is feeling better but not completely perfect. Overall he does not feel as though his pain warrants an MRI so we will just watch this for now.    ___________________________________________ Ihor Austin. Benjamin Stain, M.D., ABFM., CAQSM. Primary Care and Sports Medicine [place] [facility]  Adjunct Instructor of Family Medicine  [employer]

## 2019-09-26 ENCOUNTER — Other Ambulatory Visit: Payer: Self-pay

## 2019-09-26 ENCOUNTER — Ambulatory Visit (INDEPENDENT_AMBULATORY_CARE_PROVIDER_SITE_OTHER): Payer: BC Managed Care – PPO | Admitting: Sports Medicine

## 2019-09-26 ENCOUNTER — Encounter: Payer: Self-pay | Admitting: Sports Medicine

## 2019-09-26 DIAGNOSIS — M25511 Pain in right shoulder: Secondary | ICD-10-CM | POA: Diagnosis not present

## 2019-09-26 DIAGNOSIS — S300XXD Contusion of lower back and pelvis, subsequent encounter: Secondary | ICD-10-CM

## 2019-09-26 DIAGNOSIS — G8929 Other chronic pain: Secondary | ICD-10-CM

## 2019-09-26 NOTE — Progress Notes (Signed)
    Procedures performed today:    None.  Independent interpretation of notes and tests performed by another provider:   None.  Brief History, Exam, Impression, and Recommendations:    Chronic right shoulder pain Grant Gilbert returns, he is a pleasant 39 year old male, he has chronic right shoulder pain, on exam he had a positive O'Brien sign suspicious for a labral injury, he has been doing significantly better with his labral rehab exercises, he has not plateaued but is not perfect. He still does not feel that his pain is severe enough to warrant an MR arthrogram or invasive procedures so he is going to continue conservative treatment for at least another 2 months. At the return visit if he is still having discomfort we will proceed with MR arthrogram of the right shoulder.  Contusion, buttock, left Grant Gilbert also had a fairly large appearing contusion with fullness over his left buttock. At the last visit I ultrasounded it and there was a small subcutaneous seroma, this was injected, his symptoms and the appearance has improved considerably but it is still there, he will continue to massage this and work aggressively on his gluteus maximus to hide the tiny divot. Return as needed for this.    ___________________________________________ Ihor Austin. Benjamin Stain, M.D., ABFM., CAQSM. Primary Care and Sports Medicine [place] [facility]  Adjunct Instructor of Family Medicine  [employer]

## 2019-09-26 NOTE — Assessment & Plan Note (Signed)
Grant Gilbert returns, he is a pleasant 39 year old male, he has chronic right shoulder pain, on exam he had a positive O'Brien sign suspicious for a labral injury, he has been doing significantly better with his labral rehab exercises, he has not plateaued but is not perfect. He still does not feel that his pain is severe enough to warrant an MR arthrogram or invasive procedures so he is going to continue conservative treatment for at least another 2 months. At the return visit if he is still having discomfort we will proceed with MR arthrogram of the right shoulder.

## 2019-09-26 NOTE — Assessment & Plan Note (Signed)
Grant Gilbert also had a fairly large appearing contusion with fullness over his left buttock. At the last visit I ultrasounded it and there was a small subcutaneous seroma, this was injected, his symptoms and the appearance has improved considerably but it is still there, he will continue to massage this and work aggressively on his gluteus maximus to hide the tiny divot. Return as needed for this.

## 2019-11-28 ENCOUNTER — Ambulatory Visit: Payer: BC Managed Care – PPO | Admitting: Sports Medicine

## 2019-12-10 ENCOUNTER — Encounter: Payer: Self-pay | Admitting: Sports Medicine

## 2019-12-10 ENCOUNTER — Ambulatory Visit (INDEPENDENT_AMBULATORY_CARE_PROVIDER_SITE_OTHER): Payer: BC Managed Care – PPO | Admitting: Sports Medicine

## 2019-12-10 DIAGNOSIS — S300XXD Contusion of lower back and pelvis, subsequent encounter: Secondary | ICD-10-CM

## 2019-12-10 DIAGNOSIS — S39011A Strain of muscle, fascia and tendon of abdomen, initial encounter: Secondary | ICD-10-CM

## 2019-12-10 MED ORDER — MELOXICAM 15 MG PO TABS
ORAL_TABLET | ORAL | 3 refills | Status: DC
Start: 2019-12-10 — End: 2022-04-07

## 2019-12-10 NOTE — Assessment & Plan Note (Signed)
Approximately 1 week ago Grant Gilbert was lifting some weights, he felt a pull in his anterior lower abdominal wall. He has no GI symptoms, no signs or symptoms of obstruction. There is minimal tenderness over his linea semilunaris bilaterally below the umbilicus. Otherwise soft nonsurgical belly. I think he simply has an abdominal wall strain, he can cross train on other machines for now, I have applied an abdominal wall binder, return to see me in a couple weeks and if still symptomatic we will pull the trigger for CT abdomen and pelvis looking for hernia.

## 2019-12-10 NOTE — Addendum Note (Signed)
Addended by: Monica Becton on: 12/10/2019 03:48 PM   Modules accepted: Orders, Level of Service

## 2019-12-10 NOTE — Progress Notes (Signed)
    Procedures performed today:    None.  Independent interpretation of notes and tests performed by another provider:   None.  Brief History, Exam, Impression, and Recommendations:    Contusion, buttock, left Grant Gilbert returns, initially had seen him approximately 3 to 4 months ago for a contusion over his left buttock, he did have a small subcutaneous seroma, this was injected and symptoms and appearance improved considerably. He has a small divot, which is barely perceptible, symptoms are for the most part gone, I think he can return as needed for this.  Abdominal wall strain Approximately 1 week ago [name] was lifting some weights, he felt a pull in his anterior lower abdominal wall. He has no GI symptoms, no signs or symptoms of obstruction. There is minimal tenderness over his linea semilunaris bilaterally below the umbilicus. Otherwise soft nonsurgical belly. I think he simply has an abdominal wall strain, he can cross train on other machines for now, I have applied an abdominal wall binder, return to see me in a couple weeks and if still symptomatic we will pull the trigger for CT abdomen and pelvis looking for hernia.    ___________________________________________ Ihor Austin. Benjamin Stain, M.D., ABFM., CAQSM. Primary Care and Sports Medicine [place] [facility]  Adjunct Instructor of Family Medicine  [employer]

## 2019-12-10 NOTE — Assessment & Plan Note (Signed)
Grant Gilbert returns, initially had seen him approximately 3 to 4 months ago for a contusion over his left buttock, he did have a small subcutaneous seroma, this was injected and symptoms and appearance improved considerably. He has a small divot, which is barely perceptible, symptoms are for the most part gone, I think he can return as needed for this.

## 2019-12-24 ENCOUNTER — Other Ambulatory Visit: Payer: Self-pay

## 2019-12-24 ENCOUNTER — Ambulatory Visit (INDEPENDENT_AMBULATORY_CARE_PROVIDER_SITE_OTHER): Payer: BC Managed Care – PPO | Admitting: Sports Medicine

## 2019-12-24 DIAGNOSIS — S39011D Strain of muscle, fascia and tendon of abdomen, subsequent encounter: Secondary | ICD-10-CM

## 2019-12-24 NOTE — Progress Notes (Signed)
    Procedures performed today:    None.  Independent interpretation of notes and tests performed by another provider:   None.  Brief History, Exam, Impression, and Recommendations:    Abdominal wall strain Returns, he was lifting weights and felt a pull in his anterior lower abdominal wall, no GI symptoms, symptoms of obstruction. At the last visit he had minimal tenderness at the linea semilunaris bilaterally below the umbilicus, otherwise soft belly. We suspected abdominal wall strain, he has improved some over the last 2 weeks, still has no signs of obstruction. I think we can just watch this for another few weeks before pulling the trigger for CT. Follow-up will be open-ended, there is a low risk of morbidity with additional treatment.    ___________________________________________ Ihor Austin. Benjamin Stain, M.D., ABFM., CAQSM. Primary Care and Sports Medicine [facility]  Adjunct Instructor of Family Medicine  [employer]

## 2019-12-24 NOTE — Assessment & Plan Note (Signed)
Returns, he was lifting weights and felt a pull in his anterior lower abdominal wall, no GI symptoms, symptoms of obstruction. At the last visit he had minimal tenderness at the linea semilunaris bilaterally below the umbilicus, otherwise soft belly. We suspected abdominal wall strain, he has improved some over the last 2 weeks, still has no signs of obstruction. I think we can just watch this for another few weeks before pulling the trigger for CT. Follow-up will be open-ended, there is a low risk of morbidity with additional treatment.

## 2020-06-01 ENCOUNTER — Emergency Department (INDEPENDENT_AMBULATORY_CARE_PROVIDER_SITE_OTHER)
Admission: EM | Admit: 2020-06-01 | Discharge: 2020-06-01 | Disposition: A | Discharge status: Home / Self Care | Payer: BC Managed Care – PPO | Source: Home / Self Care

## 2020-06-01 ENCOUNTER — Other Ambulatory Visit: Payer: Self-pay

## 2020-06-01 DIAGNOSIS — R059 Cough, unspecified: Secondary | ICD-10-CM | POA: Diagnosis not present

## 2020-06-01 DIAGNOSIS — J069 Acute upper respiratory infection, unspecified: Secondary | ICD-10-CM | POA: Diagnosis not present

## 2020-06-01 MED ORDER — HYDROCOD POLST-CPM POLST ER 10-8 MG/5ML PO SUER: 5.0000 mL | Freq: Two times a day (BID) | ORAL | 0 refills | Status: DC | PRN

## 2020-06-01 MED ORDER — AZITHROMYCIN 250 MG PO TABS: ORAL_TABLET | ORAL | 0 refills | Status: DC

## 2020-06-01 NOTE — ED Provider Notes (Signed)
Grant Gilbert CARE    CSN: [id number] Arrival date & time: 06/01/20  1912      History   Chief Complaint Chief Complaint  Patient presents with  . Cough    HPI Grant Gilbert is a 39 y.o. male.   Is a 39 year old established [facility] urgent care patient who comes in complaining of cough.  Patient presents to Urgent Care with complaints of cough since about a week ago. Patient reports he has a fever too, has been taking tylenol for his symptoms. Wife was seen for same last week and was given antibiotics, pt is hoping to get the same.  Pt has not been vaccinated for covid, no recent covid testing. Wife tested negative last week when she was seen.  Patient has been getting worse over the last 24 hours.   Patient works in a call center.     History reviewed. No pertinent past medical history.  Patient Active Problem List   Diagnosis Date Noted  . Abdominal wall strain 12/10/2019  . Contusion, buttock, left 08/08/2019  . Chronic right shoulder pain 08/08/2019    History reviewed. No pertinent surgical history.     Home Medications    Prior to Admission medications   Medication Sig Start Date End Date Taking? Authorizing Provider  azithromycin (ZITHROMAX) 250 MG tablet Take 2 tabs PO x 1 dose, then 1 tab PO QD x 4 days 06/01/20  Yes Elvina Sidle, MD  chlorpheniramine-HYDROcodone [facility] PENNKINETIC ER) 10-8 MG/5ML SUER Take 5 mLs by mouth every 12 (twelve) hours as needed for cough. 06/01/20  Yes Elvina Sidle, MD  meloxicam (MOBIC) 15 MG tablet One tab PO qAM with a meal for 2 weeks, then daily prn pain. 12/10/19   Monica Becton, MD    Family History Family History  Problem Relation Age of Onset  . Parkinson's disease Father   . Healthy Mother     Social History Social History   Tobacco Use  . Smoking status: Former Smoker    Packs/day: 0.50    Types: Cigarettes    Quit date: 06/01/2018    Years since quitting: 2.0  . Smokeless  tobacco: Never Used  Vaping Use  . Vaping Use: Never used  Substance Use Topics  . Alcohol use: Yes    Alcohol/week: 3.0 standard drinks    Types: 3 Cans of beer per week  . Drug use: No     Allergies   Patient has no known allergies.   Review of Systems Review of Systems  Constitutional: Positive for chills and fever.  Respiratory: Positive for cough.   Musculoskeletal: Positive for myalgias.     Physical Exam Triage Vital Signs ED Triage Vitals  Enc Vitals Group     BP 06/01/20 1928 (!) 139/95     Pulse Rate 06/01/20 1928 97     Resp 06/01/20 1928 16     Temp 06/01/20 1928 99.2 F (37.3 C)     Temp Source 06/01/20 1928 Oral     SpO2 06/01/20 1928 96 %     Weight --      Height --      Head Circumference --      Peak Flow --      Pain Score 06/01/20 1926 0     Pain Loc --      Pain Edu? --      Excl. in GC? --    No data found.  Updated Vital Signs BP (!) 139/95 (BP Location:  Right Arm)   Pulse 97   Temp 99.2 F (37.3 C) (Oral)   Resp 16   SpO2 96%    Physical Exam Vitals and nursing note reviewed.  Constitutional:      General: He is not in acute distress.    Appearance: Normal appearance. He is normal weight. He is ill-appearing.  HENT:     Head: Normocephalic.     Mouth/Throat:     Mouth: Mucous membranes are moist.  Eyes:     Conjunctiva/sclera: Conjunctivae normal.  Cardiovascular:     Rate and Rhythm: Normal rate.     Heart sounds: Normal heart sounds.  Pulmonary:     Effort: Pulmonary effort is normal.     Breath sounds: Wheezing present.  Musculoskeletal:        General: Normal range of motion.     Cervical back: Normal range of motion.  Skin:    General: Skin is warm and dry.  Neurological:     General: No focal deficit present.     Mental Status: He is alert and oriented to person, place, and time.  Psychiatric:        Mood and Affect: Mood normal.      UC Treatments / Results  Labs (all labs ordered are listed, but  only abnormal results are displayed) Labs Reviewed  COVID-19, FLU A+B AND RSV    EKG   Radiology No results found.  Procedures Procedures (including critical care time)  Medications Ordered in UC Medications - No data to display  Initial Impression / Assessment and Plan / UC Course  I have reviewed the triage vital signs and the nursing notes.  Pertinent labs & imaging results that were available during my care of the patient were reviewed by me and considered in my medical decision making (see chart for details).    Final Clinical Impressions(s) / UC Diagnoses   Final diagnoses:  Cough  Viral URI with cough   Discharge Instructions   None    ED Prescriptions    Medication Sig Dispense Auth. Provider   chlorpheniramine-HYDROcodone (TUSSIONEX PENNKINETIC ER) 10-8 MG/5ML SUER Take 5 mLs by mouth every 12 (twelve) hours as needed for cough. 115 mL Elvina Sidle, MD   azithromycin (ZITHROMAX) 250 MG tablet Take 2 tabs PO x 1 dose, then 1 tab PO QD x 4 days 6 tablet Elvina Sidle, MD     PDMP not reviewed this encounter.   Elvina Sidle, MD 06/01/20 2003

## 2020-06-01 NOTE — ED Triage Notes (Signed)
Patient presents to Urgent Care with complaints of cough since about a week ago. Patient reports he has a fever too, has been taking tylenol for his symptoms. Wife was seen for same last week and was given antibiotics, pt is hoping to get the same.  Pt has not been vaccinated for covid, no recent covid testing. Wife tested negative last week when she was seen.

## 2020-06-04 LAB — COVID-19, FLU A+B AND RSV
Influenza A, NAA: NOT DETECTED
Influenza B, NAA: NOT DETECTED
RSV, NAA: NOT DETECTED
SARS-CoV-2, NAA: NOT DETECTED

## 2020-07-16 DIAGNOSIS — S0501XA Injury of conjunctiva and corneal abrasion without foreign body, right eye, initial encounter: Secondary | ICD-10-CM | POA: Diagnosis not present

## 2020-07-16 DIAGNOSIS — Z87891 Personal history of nicotine dependence: Secondary | ICD-10-CM | POA: Diagnosis not present

## 2020-07-16 DIAGNOSIS — H53149 Visual discomfort, unspecified: Secondary | ICD-10-CM | POA: Diagnosis not present

## 2020-07-16 DIAGNOSIS — H5711 Ocular pain, right eye: Secondary | ICD-10-CM | POA: Diagnosis not present

## 2020-11-08 DIAGNOSIS — U071 COVID-19: Secondary | ICD-10-CM | POA: Diagnosis not present

## 2020-11-13 DIAGNOSIS — Z20822 Contact with and (suspected) exposure to covid-19: Secondary | ICD-10-CM | POA: Diagnosis not present

## 2021-11-25 IMAGING — DX DG LUMBAR SPINE COMPLETE 4+V
5 series · 5 of 5 positions shown · non-contrast
Comparison: None.

CLINICAL DATA: Pain

EXAM:
LUMBAR SPINE - COMPLETE 4+ VIEW

[l-spine ap]
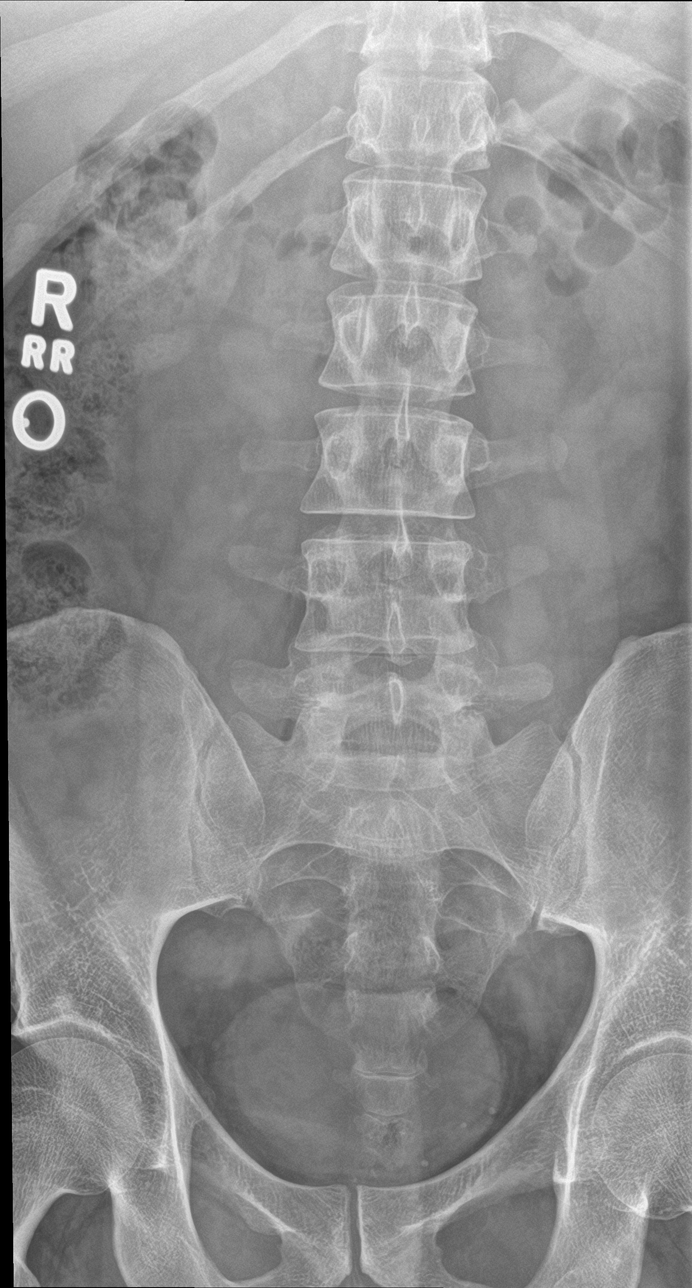

[l-spine obl (1 of 2)]
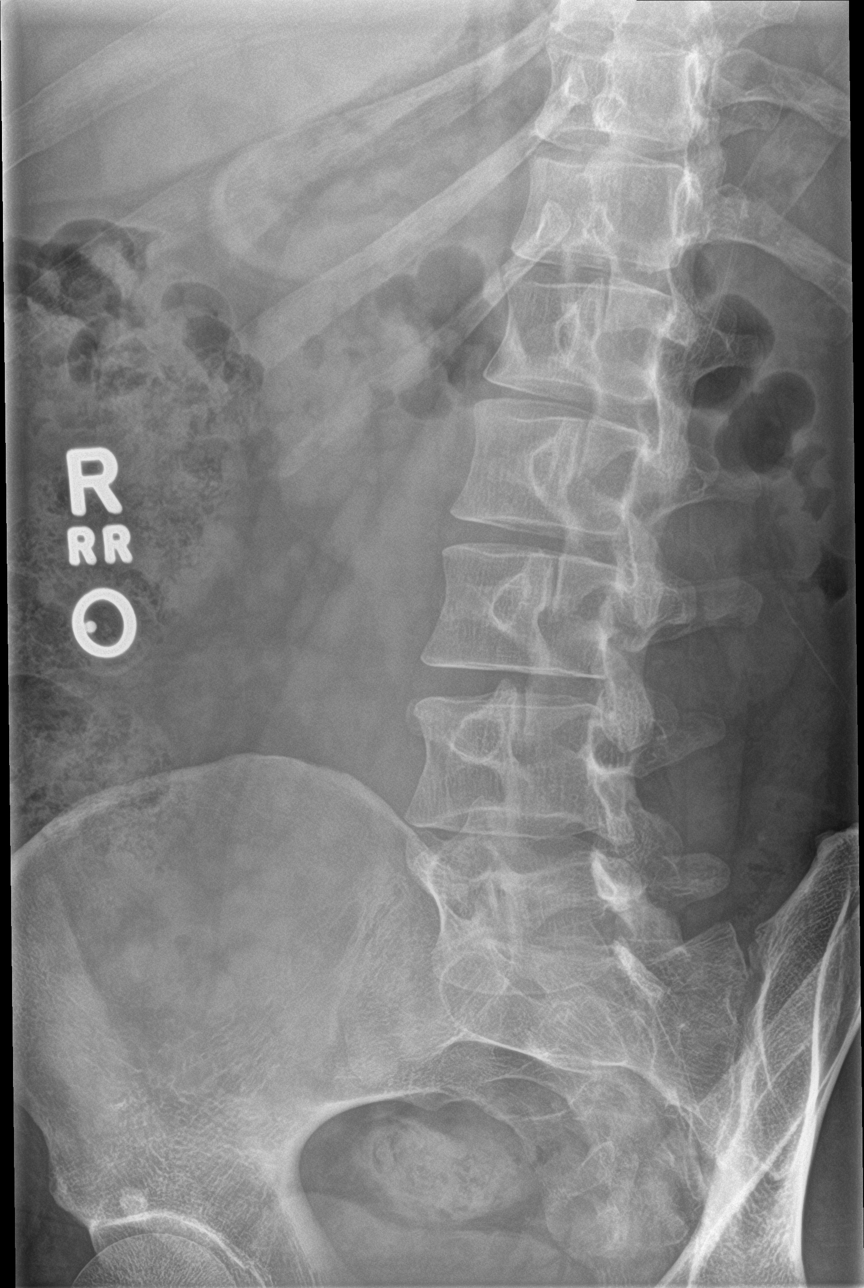

[l-spine obl (2 of 2)]
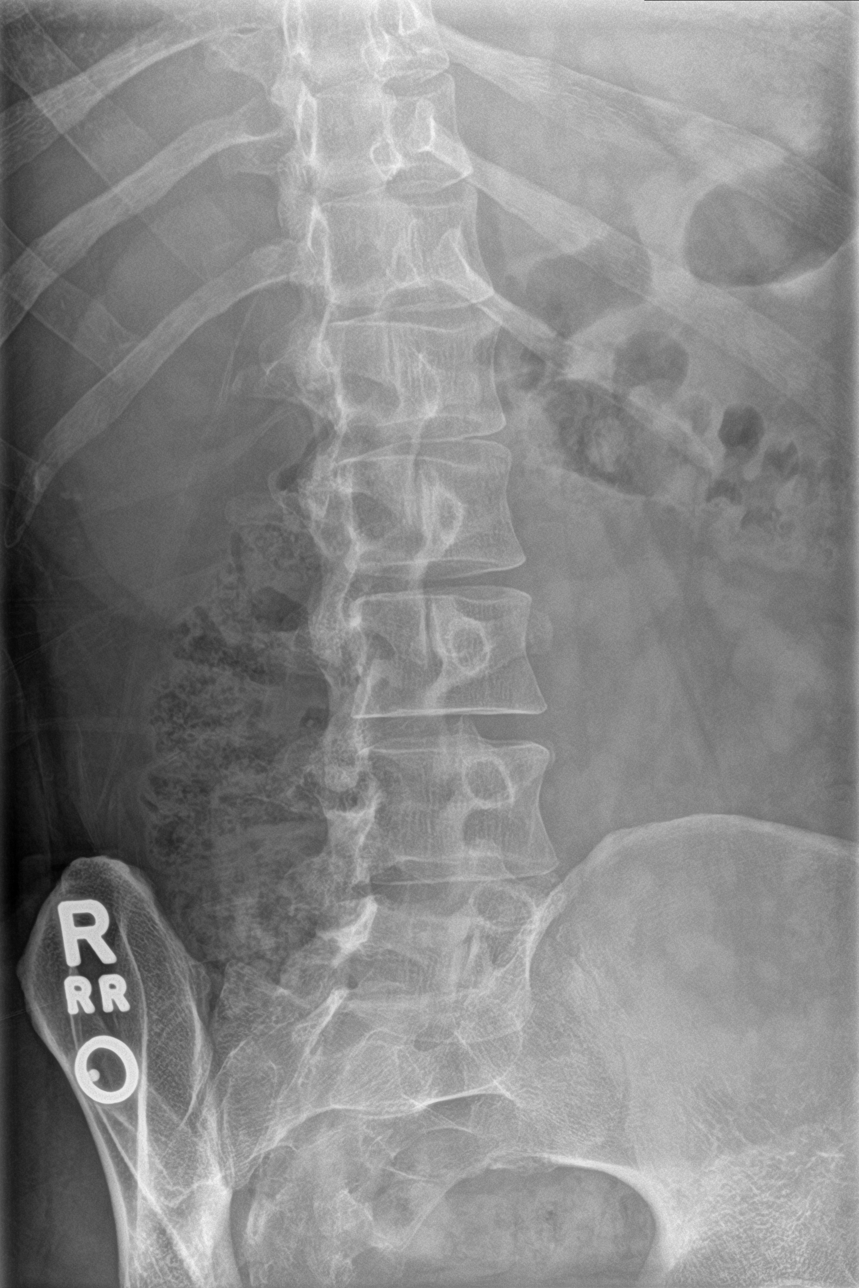

[l-spine lat]
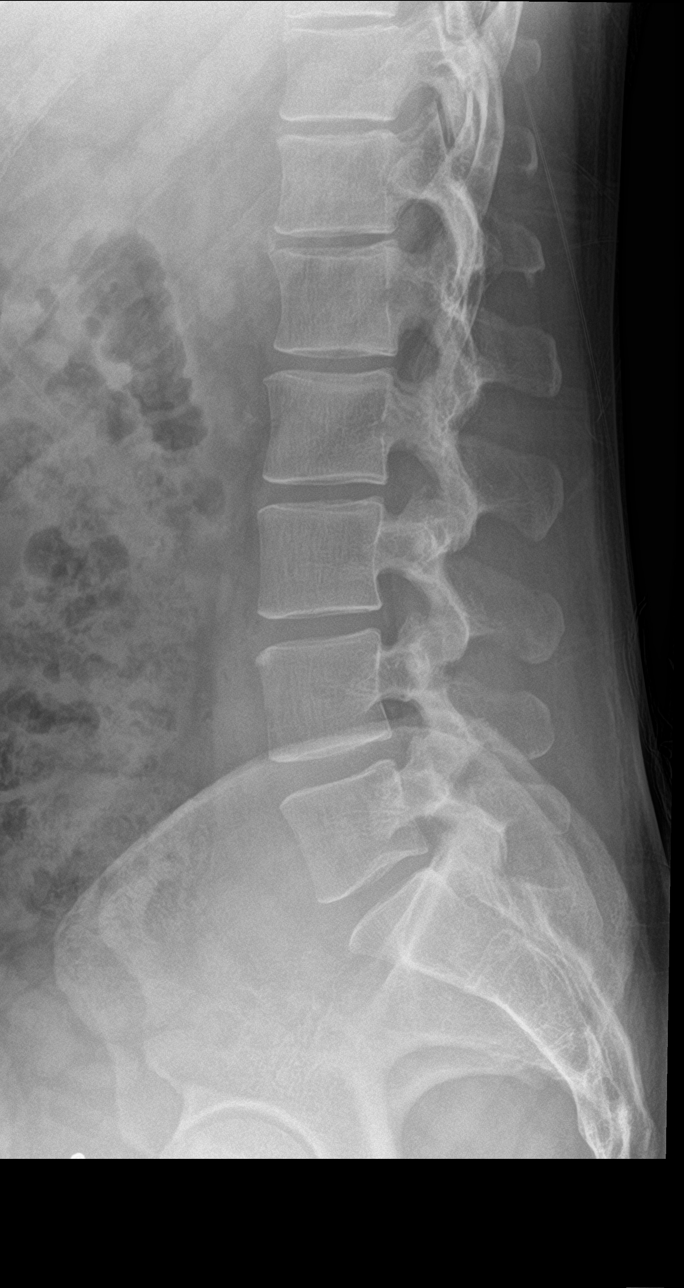

[l-spine spot]
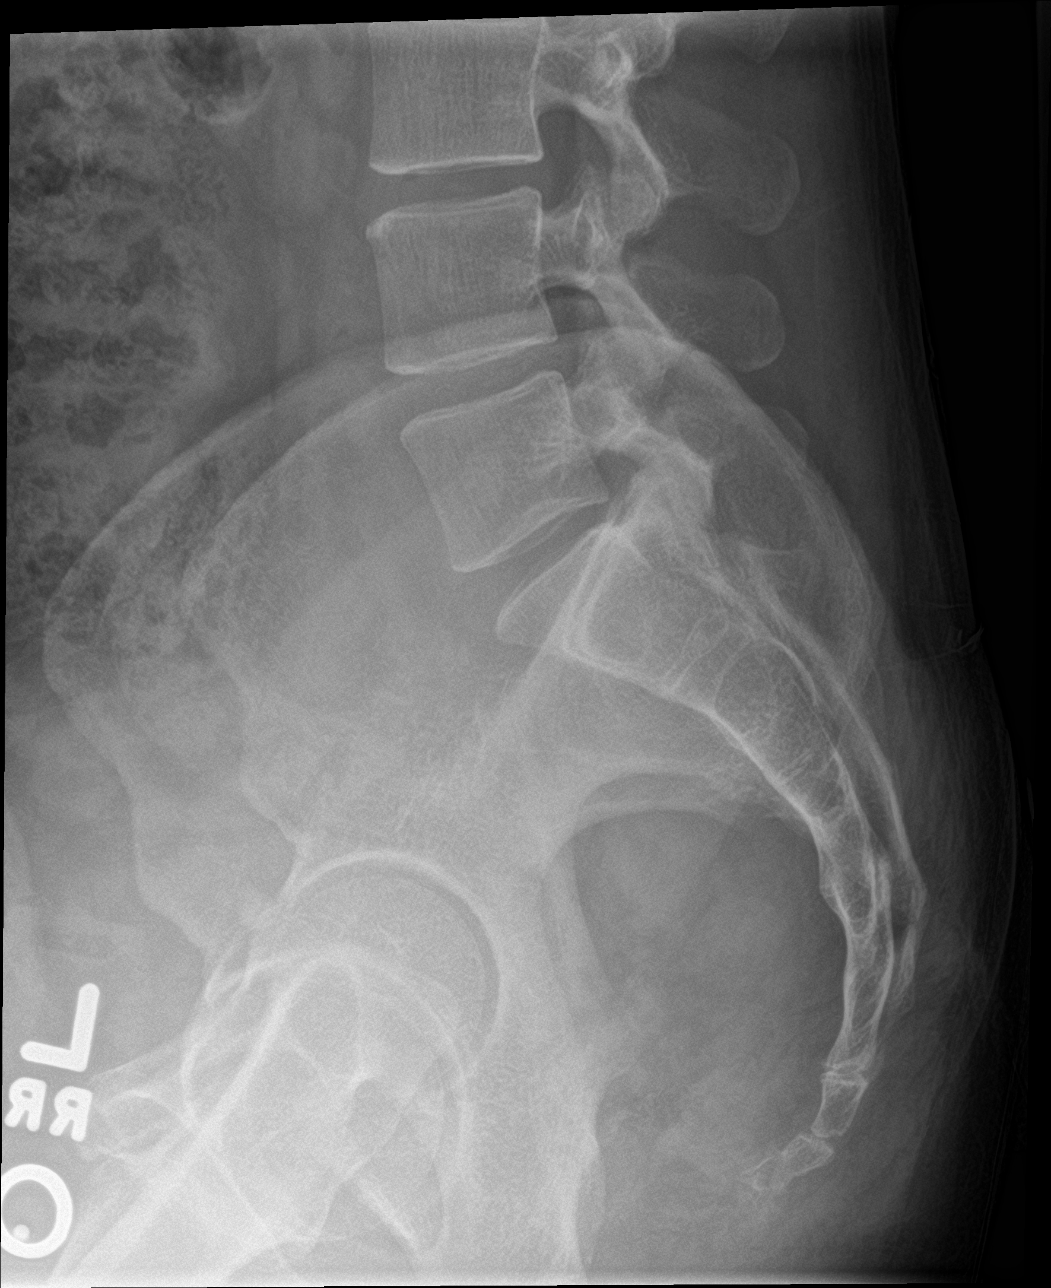

[5 of 5 positions shown; findings below may reference images not displayed]

FINDINGS: There is no evidence of lumbar spine fracture. Alignment is normal.
Intervertebral disc spaces are maintained. Minimal disc height loss
is noted at the L5-S1 level.
IMPRESSION: Negative.

## 2022-04-07 ENCOUNTER — Ambulatory Visit (INDEPENDENT_AMBULATORY_CARE_PROVIDER_SITE_OTHER): Payer: BC Managed Care – PPO

## 2022-04-07 ENCOUNTER — Encounter: Payer: BC Managed Care – PPO | Admitting: Sports Medicine

## 2022-04-07 ENCOUNTER — Encounter: Payer: Self-pay | Admitting: Sports Medicine

## 2022-04-07 ENCOUNTER — Ambulatory Visit (INDEPENDENT_AMBULATORY_CARE_PROVIDER_SITE_OTHER): Payer: BC Managed Care – PPO | Admitting: Sports Medicine

## 2022-04-07 VITALS — BP 123/85 | HR 57 | Wt 166.0 lb

## 2022-04-07 DIAGNOSIS — M25511 Pain in right shoulder: Secondary | ICD-10-CM | POA: Diagnosis not present

## 2022-04-07 DIAGNOSIS — M722 Plantar fascial fibromatosis: Secondary | ICD-10-CM | POA: Insufficient documentation

## 2022-04-07 DIAGNOSIS — G8929 Other chronic pain: Secondary | ICD-10-CM

## 2022-04-07 MED ORDER — MELOXICAM 15 MG PO TABS: ORAL_TABLET | ORAL | 3 refills | Status: DC

## 2022-04-07 NOTE — Assessment & Plan Note (Signed)
Painful right heel, plantar aspect worse in the morning, classic plantar fasciitis, adding home physical therapy, he will avoid barefoot walking, meloxicam, referral for custom molded orthotics. Return to see me in 6 weeks, injection +/- dorsal splinting if not better.

## 2022-04-07 NOTE — Assessment & Plan Note (Signed)
Grant Gilbert returns, he is a very pleasant 41 year old male, chronic right shoulder pain, I last saw him in 2021 for this, and his physical exam was consistent with more of a labral injury, continues to have right shoulder pain, he told me he did take some over-the-counter supplements that seem to help so I encouraged him to just go back to this, and he can pursue more aggressive modern medicine should this not work. I would like an updated x-ray and I would like to add some home physical therapy for him to do in the meantime.

## 2022-04-07 NOTE — Progress Notes (Signed)
    Procedures performed today:    None.  Independent interpretation of notes and tests performed by another provider:   None.  Brief History, Exam, Impression, and Recommendations:    Chronic right shoulder pain Grant Gilbert returns, he is a very pleasant 41 year old male, chronic right shoulder pain, I last saw him in 2021 for this, and his physical exam was consistent with more of a labral injury, continues to have right shoulder pain, he told me he did take some over-the-counter supplements that seem to help so I encouraged him to just go back to this, and he can pursue more aggressive modern medicine should this not work. I would like an updated x-ray and I would like to add some home physical therapy for him to do in the meantime.  Plantar fasciitis, right Painful right heel, plantar aspect worse in the morning, classic plantar fasciitis, adding home physical therapy, he will avoid barefoot walking, meloxicam, referral for custom molded orthotics. Return to see me in 6 weeks, injection +/- dorsal splinting if not better.    ____________________________________________ Gwen Her. Dianah Field, M.D., ABFM., CAQSM., AME. Primary Care and Sports Medicine [facility]  Adjunct Professor of [employer]  Risk manager

## 2022-04-26 ENCOUNTER — Encounter: Payer: BC Managed Care – PPO | Admitting: Family Medicine

## 2022-05-04 ENCOUNTER — Encounter: Payer: Self-pay | Admitting: Family Medicine

## 2022-05-04 ENCOUNTER — Ambulatory Visit (INDEPENDENT_AMBULATORY_CARE_PROVIDER_SITE_OTHER): Payer: BC Managed Care – PPO | Admitting: Family Medicine

## 2022-05-04 VITALS — Ht 67.0 in | Wt 166.0 lb

## 2022-05-04 DIAGNOSIS — M722 Plantar fascial fibromatosis: Secondary | ICD-10-CM

## 2022-05-04 NOTE — Progress Notes (Signed)
  Halo Laski - 41 y.o. male MRN [id number]  Date of birth: Jun 18, 1980  SUBJECTIVE:  Including CC & ROS.  No chief complaint on file.   Jeremian Whitby is a 41 y.o. male that is  here for foot pain. Worse with the first few steps in the morning.    Review of Systems See HPI   HISTORY: Past Medical, Surgical, Social, and Family History Reviewed & Updated per EMR.   Pertinent Historical Findings include:  History reviewed. No pertinent past medical history.  History reviewed. No pertinent surgical history.   PHYSICAL EXAM:  VS: Ht 5\' 7"  (1.702 m)   Wt 166 lb (75.3 kg)   BMI 26.00 kg/m  Physical Exam Gen: NAD, alert, cooperative with exam, well-appearing MSK:  Neurovascularly intact    Patient was fitted for a standard, cushioned, semi-rigid orthotic. The orthotic was heated and afterward the patient stood on the orthotic blank positioned on the orthotic stand. The patient was positioned in subtalar neutral position and 10 degrees of ankle dorsiflexion in a weight bearing stance. After completion of molding, a stable base was applied to the orthotic blank. The blank was ground to a stable position for weight bearing. Size: 8 Pairs: 2 Base: Blue EVA Additional Posting and Padding: None The patient ambulated these, and they were very comfortable.   ASSESSMENT & PLAN:   Plantar fasciitis, right Acutely occurring. Continues to have pain with modalities in placed.  - counseled on home exercise therapy and supportive care - orthotics.

## 2022-05-04 NOTE — Assessment & Plan Note (Signed)
Acutely occurring. Continues to have pain with modalities in placed.  - counseled on home exercise therapy and supportive care - orthotics.

## 2022-05-12 ENCOUNTER — Ambulatory Visit: Payer: BC Managed Care – PPO | Admitting: Family Medicine

## 2022-05-17 ENCOUNTER — Ambulatory Visit (INDEPENDENT_AMBULATORY_CARE_PROVIDER_SITE_OTHER): Payer: BC Managed Care – PPO | Admitting: Family Medicine

## 2022-05-17 ENCOUNTER — Encounter: Payer: Self-pay | Admitting: Family Medicine

## 2022-05-17 VITALS — BP 125/69 | HR 61 | Temp 98.2°F | Ht 67.0 in | Wt 168.4 lb

## 2022-05-17 DIAGNOSIS — Z7689 Persons encountering health services in other specified circumstances: Secondary | ICD-10-CM

## 2022-05-17 DIAGNOSIS — L239 Allergic contact dermatitis, unspecified cause: Secondary | ICD-10-CM | POA: Diagnosis not present

## 2022-05-17 NOTE — Progress Notes (Unsigned)
New Patient Office Visit  Subjective    Patient ID: Grant Gilbert, male    DOB: [birth year]  Age: 41 y.o. MRN: [id number]  CC:  Chief Complaint  Patient presents with   New Patient (Initial Visit)    Patient in office here to est PCP    Rash    Patient c/o rash bilateral hands - states cleared completely when he went to beach - he is wondering if related to drinking tea. He states at the beach he drank a lot more water. He states the rash originally started  in July 2023. He was taking a lot of supplements (  creatine,vit E, MMN , NAC, Fish oil, Turmeric, Vit B12 and zinc) at the time but he stopped these and rash cleared  for a short time but has returned . Along with pain in R foot.     HPI Grant Gilbert presents to establish care  Pt reports he had a lot of stress over the last several years at work. He says he was taking over the counter supplements in Feb 2023 due to stress and unable to sleep. He does say that the [place] helped some. He also felt like he had knots on both of his flanks where 'his kidneys was'. He says the rash on his hands and ankles started in July 2023. He stopped taking all of the supplements  but is still on his hands. He does report they itch. He was taking a teaspoon of olive oil daily.  He denies flu vaccine.  Outpatient Encounter Medications as of 05/17/2022  Medication Sig   meloxicam (MOBIC) 15 MG tablet One tab PO every 24 hours with a meal for 2 weeks, then once every 24 hours prn pain.   [DISCONTINUED] azithromycin (ZITHROMAX) 250 MG tablet Take 2 tabs PO x 1 dose, then 1 tab PO QD x 4 days   [DISCONTINUED] chlorpheniramine-HYDROcodone (TUSSIONEX PENNKINETIC ER) 10-8 MG/5ML SUER Take 5 mLs by mouth every 12 (twelve) hours as needed for cough.   No facility-administered encounter medications on file as of 05/17/2022.    No past medical history on file.  Past Surgical History:  Procedure Laterality Date   INNER EAR SURGERY     at 41 y.o    TYMPANOSTOMY TUBE PLACEMENT Left     Family History  Problem Relation Age of Onset   Healthy Mother    Parkinson's disease Father 66    Social History   Socioeconomic History   Marital status: Married    Spouse name: Not on file   Number of children: 2   Years of education: Not on file   Highest education level: Not on file  Occupational History   Not on file  Tobacco Use   Smoking status: Former    Packs/day: 0.50    Types: Cigarettes    Quit date: 06/01/2018    Years since quitting: 3.9    Passive exposure: Never   Smokeless tobacco: Never  Vaping Use   Vaping Use: Never used  Substance and Sexual Activity   Alcohol use: Yes    Alcohol/week: 3.0 standard drinks of alcohol    Types: 3 Cans of beer per week    Comment: occasionally   Drug use: No   Sexual activity: Yes  Other Topics Concern   Not on file  Social History Narrative   Not on file   Social Determinants of Health   Financial Resource Strain: Not on file  Food Insecurity: Not on  file  Transportation Needs: Not on file  Physical Activity: Not on file  Stress: Not on file  Social Connections: Not on file  Intimate Partner Violence: Not on file    Review of Systems  Constitutional:  Negative for fever and malaise/fatigue.  HENT:  Negative for congestion and sinus pain.   Eyes:  Negative for blurred vision and double vision.  Respiratory:  Negative for cough and sputum production.   Cardiovascular:  Negative for chest pain and palpitations.  Gastrointestinal:  Negative for heartburn and nausea.  Genitourinary:  Negative for dysuria and urgency.  Musculoskeletal:  Positive for joint pain. Negative for myalgias and neck pain.       Chronic right shoulder pain  Skin:  Positive for itching and rash.  Neurological:  Negative for dizziness and tingling.  Psychiatric/Behavioral:  Negative for depression and suicidal ideas.   All other systems reviewed and are negative.       Objective    BP  125/69   Pulse 61   Temp 98.2 F (36.8 C)   Ht 5\' 7"  (1.702 m)   Wt 168 lb 6 oz (76.4 kg)   SpO2 96%   BMI 26.37 kg/m   Physical Exam Vitals and nursing note reviewed.  Constitutional:      Appearance: Normal appearance. He is normal weight.  HENT:     Head: Normocephalic and atraumatic.     Right Ear: Tympanic membrane, ear canal and external ear normal.     Left Ear: Tympanic membrane, ear canal and external ear normal.     Nose: Nose normal.     Mouth/Throat:     Mouth: Mucous membranes are moist.     Pharynx: Oropharynx is clear.  Eyes:     Extraocular Movements: Extraocular movements intact.     Conjunctiva/sclera: Conjunctivae normal.     Pupils: Pupils are equal, round, and reactive to light.  Cardiovascular:     Rate and Rhythm: Normal rate and regular rhythm.     Pulses: Normal pulses.     Heart sounds: Normal heart sounds.  Pulmonary:     Effort: Pulmonary effort is normal.     Breath sounds: Normal breath sounds.  Abdominal:     General: Abdomen is flat. Bowel sounds are normal.     Palpations: Abdomen is soft.  Skin:    General: Skin is warm.     Capillary Refill: Capillary refill takes less than 2 seconds.     Comments: Eczematic patches on bilateral palmer surface of hands  Neurological:     General: No focal deficit present.     Mental Status: He is alert. Mental status is at baseline.  Psychiatric:        Mood and Affect: Mood normal.        Behavior: Behavior normal.        Thought Content: Thought content normal.        Judgment: Judgment normal.        Assessment & Plan:   Problem List Items Addressed This Visit   None Visit Diagnoses     Encounter to establish care with new doctor    -  Primary   Allergic contact dermatitis, unspecified trigger          Eczematic like patches to bilateral hands. Likely from xerosis. Advised to moisturize hands especially since patient reports washing hands constantly all day. Will see back for  physical in a few days with fasting labs.  Return in about  2 days (around 05/19/2022) for Annual Physical.   Leeanne Rio, MD

## 2022-05-19 ENCOUNTER — Encounter: Payer: Self-pay | Admitting: Family Medicine

## 2022-05-19 ENCOUNTER — Ambulatory Visit (INDEPENDENT_AMBULATORY_CARE_PROVIDER_SITE_OTHER): Payer: BC Managed Care – PPO | Admitting: Family Medicine

## 2022-05-19 VITALS — BP 116/79 | HR 77 | Temp 98.9°F | Ht 67.0 in | Wt 167.2 lb

## 2022-05-19 DIAGNOSIS — Z136 Encounter for screening for cardiovascular disorders: Secondary | ICD-10-CM

## 2022-05-19 DIAGNOSIS — R7302 Impaired glucose tolerance (oral): Secondary | ICD-10-CM

## 2022-05-19 DIAGNOSIS — Z13220 Encounter for screening for lipoid disorders: Secondary | ICD-10-CM | POA: Diagnosis not present

## 2022-05-19 DIAGNOSIS — Z1159 Encounter for screening for other viral diseases: Secondary | ICD-10-CM

## 2022-05-19 DIAGNOSIS — Z0000 Encounter for general adult medical examination without abnormal findings: Secondary | ICD-10-CM

## 2022-05-19 NOTE — Progress Notes (Signed)
Complete physical exam  Patient: Grant Gilbert   DOB: Jun 30, 1980   41 y.o. Male  MRN: [id number]  Subjective:    Chief Complaint  Patient presents with   Annual Exam    Jarid Sasso is a 41 y.o. male who presents today for a complete physical exam. He reports consuming a general diet. Exercise is limited by orthopedic condition(s): chronic shoulder pain. He generally feels well. He reports sleeping well. He does not have additional problems to discuss today.   Has eaten today.  Most recent fall risk assessment:     No data to display           Most recent depression screenings:    05/17/2022    4:53 PM  PHQ 2/9 Scores  PHQ - 2 Score 0  PHQ- 9 Score 2    Vision:Not within last year   Patient Active Problem List   Diagnosis Date Noted   Plantar fasciitis, right 04/07/2022   Abdominal wall strain 12/10/2019   Contusion, buttock, left 08/08/2019   Chronic right shoulder pain 08/08/2019   No past medical history on file. Past Surgical History:  Procedure Laterality Date   INNER EAR SURGERY     at 41 y.o   TYMPANOSTOMY TUBE PLACEMENT Left    No Known Allergies    Patient Care Team: Leeanne Rio, MD as PCP - General (Family Medicine)   Outpatient Medications Prior to Visit  Medication Sig   meloxicam (MOBIC) 15 MG tablet One tab PO every 24 hours with a meal for 2 weeks, then once every 24 hours prn pain.   No facility-administered medications prior to visit.    Review of Systems  Constitutional:  Negative for chills and fever.  HENT:  Negative for hearing loss and sinus pain.   Respiratory:  Negative for cough and hemoptysis.   Cardiovascular:  Negative for chest pain and palpitations.  Skin:  Negative for itching and rash.  Psychiatric/Behavioral:  Negative for depression. The patient does not have insomnia.   All other systems reviewed and are negative.         Objective:     BP 116/79   Pulse 77   Temp 98.9 F (37.2 C)   Ht _0   (1.702 m)   Wt 167 lb 4 oz (75.9 kg)   SpO2 98%   BMI 26.20 kg/m  BP Readings from Last 3 Encounters:  05/19/22 116/79  05/17/22 125/69  04/07/22 123/85      Physical Exam Vitals and nursing note reviewed.  Constitutional:      Appearance: Normal appearance. He is normal weight.  HENT:     Head: Normocephalic and atraumatic.     Right Ear: Tympanic membrane, ear canal and external ear normal.     Left Ear: Tympanic membrane, ear canal and external ear normal.     Nose: Nose normal.     Mouth/Throat:     Mouth: Mucous membranes are moist.     Pharynx: Oropharynx is clear.  Eyes:     Extraocular Movements: Extraocular movements intact.     Conjunctiva/sclera: Conjunctivae normal.     Pupils: Pupils are equal, round, and reactive to light.  Cardiovascular:     Rate and Rhythm: Normal rate and regular rhythm.     Pulses: Normal pulses.     Heart sounds: Normal heart sounds.  Pulmonary:     Effort: Pulmonary effort is normal.     Breath sounds: Normal breath sounds.  Abdominal:  General: Abdomen is flat. Bowel sounds are normal. There is no distension.     Palpations: Abdomen is soft.  Skin:    General: Skin is warm.     Capillary Refill: Capillary refill takes less than 2 seconds.  Neurological:     General: No focal deficit present.     Mental Status: He is alert and oriented to person, place, and time. Mental status is at baseline.  Psychiatric:        Mood and Affect: Mood normal.        Behavior: Behavior normal.        Thought Content: Thought content normal.      No results found for any visits on 05/19/22.      Assessment & Plan:    Routine Health Maintenance and Physical Exam  Immunization History  Administered Date(s) Administered   Hepatitis B, PED/ADOLESCENT 03/15/1994, 04/18/1994, 09/12/1994   MMR 04/18/1994   Tdap 09/06/2017    Health Maintenance  Topic Date Due   HIV Screening  Never done   Hepatitis C Screening  Never done   COVID-19  Vaccine (1) 06/04/2022 (Originally 01/13/1982)   INFLUENZA VACCINE  09/03/2022 (Originally 01/03/2022)   DTaP/Tdap/Td (2 - Td or Tdap) 09/07/2027   HPV VACCINES  Aged Out    Discussed health benefits of physical activity, and encouraged him to engage in regular exercise appropriate for his age and condition.  Problem List Items Addressed This Visit   None Visit Diagnoses     Annual physical exam    -  Primary   Need for hepatitis C screening test       Relevant Orders   Hepatitis C antibody   Screening for viral disease       Relevant Orders   HIV Antibody (routine testing w rflx)   Impaired glucose tolerance       Relevant Orders   CBC with Differential/Platelet   Comprehensive metabolic panel   Hemoglobin A1c   Encounter for lipid screening for cardiovascular disease       Relevant Orders   Lipid panel      Return in about 1 year (around 05/20/2023) for Annual Physical. Pt declines all vaccines     Leeanne Rio, MD

## 2022-05-22 LAB — COMPREHENSIVE METABOLIC PANEL
ALT: 62 IU/L — ABNORMAL HIGH (ref 0–44)
AST: 22 IU/L (ref 0–40)
Albumin/Globulin Ratio: 2.3 — ABNORMAL HIGH (ref 1.2–2.2)
Albumin: 4.8 g/dL (ref 4.1–5.1)
Alkaline Phosphatase: 64 IU/L (ref 44–121)
BUN/Creatinine Ratio: 8 — ABNORMAL LOW (ref 9–20)
BUN: 9 mg/dL (ref 6–24)
Bilirubin Total: 0.5 mg/dL (ref 0.0–1.2)
CO2: 28 mmol/L (ref 20–29)
Calcium: 9.5 mg/dL (ref 8.7–10.2)
Chloride: 103 mmol/L (ref 96–106)
Creatinine, Ser: 1.1 mg/dL (ref 0.76–1.27)
Globulin, Total: 2.1 g/dL (ref 1.5–4.5)
Glucose: 111 mg/dL — ABNORMAL HIGH (ref 70–99)
Potassium: 4.1 mmol/L (ref 3.5–5.2)
Sodium: 143 mmol/L (ref 134–144)
Total Protein: 6.9 g/dL (ref 6.0–8.5)
eGFR: 87 mL/min/{1.73_m2} (ref >59)

## 2022-05-22 LAB — HEPATITIS C ANTIBODY: Hep C Virus Ab: NONREACTIVE

## 2022-05-22 LAB — CBC WITH DIFFERENTIAL/PLATELET
Basophils Absolute: 0 10*3/uL (ref 0.0–0.2)
Basos: 1 %
EOS (ABSOLUTE): 0.1 10*3/uL (ref 0.0–0.4)
Eos: 2 %
Hematocrit: 51.2 % — ABNORMAL HIGH (ref 37.5–51.0)
Hemoglobin: 18 g/dL — ABNORMAL HIGH (ref 13.0–17.7)
Immature Grans (Abs): 0 10*3/uL (ref 0.0–0.1)
Immature Granulocytes: 0 %
Lymphocytes Absolute: 2.5 10*3/uL (ref 0.7–3.1)
Lymphs: 51 %
MCH: 29.8 pg (ref 26.6–33.0)
MCHC: 35.2 g/dL (ref 31.5–35.7)
MCV: 85 fL (ref 79–97)
Monocytes Absolute: 0.4 10*3/uL (ref 0.1–0.9)
Monocytes: 8 %
Neutrophils Absolute: 1.8 10*3/uL (ref 1.4–7.0)
Neutrophils: 38 %
Platelets: 199 10*3/uL (ref 150–450)
RBC: 6.05 x10E6/uL — ABNORMAL HIGH (ref 4.14–5.80)
RDW: 12.3 % (ref 11.6–15.4)
WBC: 4.8 10*3/uL (ref 3.4–10.8)

## 2022-05-22 LAB — LIPID PANEL
Chol/HDL Ratio: 3.5 ratio (ref 0.0–5.0)
Cholesterol, Total: 99 mg/dL — ABNORMAL LOW (ref 100–199)
HDL: 28 mg/dL — ABNORMAL LOW (ref >39)
LDL Chol Calc (NIH): 46 mg/dL (ref 0–99)
Triglycerides: 143 mg/dL (ref 0–149)
VLDL Cholesterol Cal: 25 mg/dL (ref 5–40)

## 2022-05-22 LAB — HEMOGLOBIN A1C
Est. average glucose Bld gHb Est-mCnc: 114 mg/dL
Hgb A1c MFr Bld: 5.6 % (ref 4.8–5.6)

## 2022-05-22 LAB — HIV ANTIBODY (ROUTINE TESTING W REFLEX): HIV Screen 4th Generation wRfx: NONREACTIVE

## 2022-07-23 ENCOUNTER — Ambulatory Visit
Admission: EM | Admit: 2022-07-23 | Discharge: 2022-07-23 | Disposition: A | Discharge status: Home / Self Care | Payer: BC Managed Care – PPO | Attending: Family Medicine | Admitting: Family Medicine

## 2022-07-23 DIAGNOSIS — B352 Tinea manuum: Secondary | ICD-10-CM

## 2022-07-23 MED ORDER — TERBINAFINE HCL 250 MG PO TABS: 250.0000 mg | ORAL_TABLET | Freq: Every day | ORAL | 0 refills | Status: DC

## 2022-07-23 MED ORDER — MUPIROCIN 2 % EX OINT: 1.0000 | TOPICAL_OINTMENT | Freq: Two times a day (BID) | CUTANEOUS | 0 refills | Status: DC

## 2022-07-23 NOTE — ED Provider Notes (Signed)
[facility]    CSN: [id number] Arrival date & time: 07/23/22  1312      History   Chief Complaint Chief Complaint  Patient presents with   Rash    HPI Grant Gilbert is a 42 y.o. male.   HPI  Patient states that he has a rash on his hands.  Is been present for over 6 months.  It itches moderately.  He has tried variety of lotions.  Anti-inflammatory and antifungal.  His doctor told him that was eczema.  Recommended emollients.  He states that this feels like it is making the rash worse.  No testing has been done.  No rash on feet or other body.  History reviewed. No pertinent past medical history.  Patient Active Problem List   Diagnosis Date Noted   Plantar fasciitis, right 04/07/2022   Abdominal wall strain 12/10/2019   Contusion, buttock, left 08/08/2019   Chronic right shoulder pain 08/08/2019    Past Surgical History:  Procedure Laterality Date   INNER EAR SURGERY     at 41 y.o   TYMPANOSTOMY TUBE PLACEMENT Left        Home Medications    Prior to Admission medications   Medication Sig Start Date End Date Taking? Authorizing Provider  mupirocin ointment (BACTROBAN) 2 % Apply 1 Application topically 2 (two) times daily. 07/23/22  Yes Raylene Everts, MD  PRESCRIPTION MEDICATION Rx cream to palms of hands, unknown name   Yes [provider]  terbinafine (LAMISIL) 250 MG tablet Take 1 tablet (250 mg total) by mouth daily. 07/23/22  Yes Raylene Everts, MD  meloxicam (MOBIC) 15 MG tablet One tab PO every 24 hours with a meal for 2 weeks, then once every 24 hours prn pain. 04/07/22   Silverio Decamp, MD    Family History Family History  Problem Relation Age of Onset   Healthy Mother    Parkinson's disease Father 76    Social History Social History   Tobacco Use   Smoking status: Former    Packs/day: 0.50    Types: Cigarettes    Quit date: 06/01/2018    Years since quitting: 4.1    Passive exposure: Never   Smokeless  tobacco: Never  Vaping Use   Vaping Use: Never used  Substance Use Topics   Alcohol use: Not Currently    Comment: occasionally   Drug use: No     Allergies   Patient has no known allergies.   Review of Systems Review of Systems  See HPI Physical Exam Triage Vital Signs ED Triage Vitals  Enc Vitals Group     BP 07/23/22 1324 (!) 145/84     Pulse Rate 07/23/22 1324 73     Resp 07/23/22 1324 20     Temp 07/23/22 1324 98.2 F (36.8 C)     Temp Source 07/23/22 1324 Oral     SpO2 07/23/22 1324 96 %     Weight 07/23/22 1320 160 lb (72.6 kg)     Height 07/23/22 1320 5' 6"$  (1.676 m)     Head Circumference --      Peak Flow --      Pain Score 07/23/22 1320 0     Pain Loc --      Pain Edu? --      Excl. in [place]? --    No data found.  Updated Vital Signs BP (!) 145/84 (BP Location: Left Arm)   Pulse 73   Temp 98.2  F (36.8 C) (Oral)   Resp 20   Ht 5' 6"$  (1.676 m)   Wt 72.6 kg   SpO2 96%   BMI 25.82 kg/m      Physical Exam Constitutional:      General: He is not in acute distress.    Appearance: He is well-developed.  HENT:     Head: Normocephalic and atraumatic.  Eyes:     Conjunctiva/sclera: Conjunctivae normal.     Pupils: Pupils are equal, round, and reactive to light.  Cardiovascular:     Rate and Rhythm: Normal rate.  Pulmonary:     Effort: Pulmonary effort is normal. No respiratory distress.  Abdominal:     General: There is no distension.     Palpations: Abdomen is soft.  Musculoskeletal:        General: Normal range of motion.     Cervical back: Normal range of motion.  Skin:    General: Skin is warm and dry.     Comments: Both hands Superficial rash centrally on the palm, advancing Edge does fluoresce under [employer].  No vesicles or pustules.  Neurological:     Mental Status: He is alert.      UC Treatments / Results  Labs (all labs ordered are listed, but only abnormal results are displayed) Labs Reviewed - No data to  display  EKG   Radiology No results found.  Procedures Procedures (including critical care time)  Medications Ordered in UC Medications - No data to display  Initial Impression / Assessment and Plan / UC Course  I have reviewed the triage vital signs and the nursing notes.  Pertinent labs & imaging results that were available during my care of the patient were reviewed by me and considered in my medical decision making (see chart for details).    I told him that without a skin scraping I cannot make a definitive diagnosis.  I suspect this is tinea Manis. Final Clinical Impressions(s) / UC Diagnoses   Final diagnoses:  Tinea manuum     Discharge Instructions      Take terbinafine 1 a day for 4 weeks.  Try not to miss a dose. May use over-the-counter lotion or cream. Mupirocin is a cream for infection.  Use as needed See your doctor in follow-up   ED Prescriptions     Medication Sig Dispense Auth. Provider   terbinafine (LAMISIL) 250 MG tablet Take 1 tablet (250 mg total) by mouth daily. 28 tablet Raylene Everts, MD   mupirocin ointment (BACTROBAN) 2 % Apply 1 Application topically 2 (two) times daily. 22 g Raylene Everts, MD      PDMP not reviewed this encounter.   Raylene Everts, MD 07/23/22 [phone]

## 2022-07-23 NOTE — ED Triage Notes (Signed)
Pt presents to Urgent Care with c/o intermittent, itchy "rash" to palms of hands x 7 months. Was seen by PCP and dx w/ eczema, but reports cream that was prescribed made it worse.

## 2022-07-23 NOTE — Discharge Instructions (Signed)
Take terbinafine 1 a day for 4 weeks.  Try not to miss a dose. May use over-the-counter lotion or cream. Mupirocin is a cream for infection.  Use as needed See your doctor in follow-up

## 2022-08-16 DIAGNOSIS — B352 Tinea manuum: Secondary | ICD-10-CM | POA: Diagnosis not present

## 2022-08-16 DIAGNOSIS — L578 Other skin changes due to chronic exposure to nonionizing radiation: Secondary | ICD-10-CM | POA: Diagnosis not present

## 2022-08-16 DIAGNOSIS — D225 Melanocytic nevi of trunk: Secondary | ICD-10-CM | POA: Diagnosis not present

## 2022-09-13 DIAGNOSIS — I781 Nevus, non-neoplastic: Secondary | ICD-10-CM | POA: Diagnosis not present

## 2022-09-18 ENCOUNTER — Encounter: Payer: Self-pay | Admitting: *Deleted

## 2022-10-06 ENCOUNTER — Ambulatory Visit (INDEPENDENT_AMBULATORY_CARE_PROVIDER_SITE_OTHER): Payer: BC Managed Care – PPO | Admitting: Sports Medicine

## 2022-10-06 DIAGNOSIS — G8929 Other chronic pain: Secondary | ICD-10-CM | POA: Diagnosis not present

## 2022-10-06 DIAGNOSIS — M25511 Pain in right shoulder: Secondary | ICD-10-CM

## 2022-10-06 MED ORDER — MELOXICAM 15 MG PO TABS: ORAL_TABLET | ORAL | 3 refills | Status: DC

## 2022-10-06 NOTE — Assessment & Plan Note (Addendum)
Grant Gilbert returns, he is a very pleasant 42 year old male, lifts a lot of weights, he has chronic right shoulder pain, he was seen in 2021, we also saw him in November 2023, he was given home physical therapy, physician directed.  Exam at the time was more consistent with labral injury. Positive O'Brien's test, crank test. Unfortunately he has not improved and spite of continued conservative treatment. X-rays were unrevealing, for this reason we will proceed with MR arthrography, I will see him back for the ultrasound-guided glenohumeral gadolinium injection. Refilling meloxicam.

## 2022-10-06 NOTE — Progress Notes (Addendum)
    Procedures performed today:    None.  Independent interpretation of notes and tests performed by another provider:   None.  Brief History, Exam, Impression, and Recommendations:    Chronic right shoulder pain Dmichael returns, he is a very pleasant 42 year old male, lifts a lot of weights, he has chronic right shoulder pain, he was seen in 2021, we also saw him in November 2023, he was given home physical therapy, physician directed.  Exam at the time was more consistent with labral injury. Positive O'Brien's test, crank test. Unfortunately he has not improved and spite of continued conservative treatment. X-rays were unrevealing, for this reason we will proceed with MR arthrography, I will see him back for the ultrasound-guided glenohumeral gadolinium injection. Refilling meloxicam.    ____________________________________________ Ihor Austin. Benjamin Stain, M.D., ABFM., CAQSM., AME. Primary Care and Sports Medicine [facility]  Adjunct Professor of Family Medicine  [employer] [employer] [facility] [employer]  Restaurant manager, fast food

## 2022-10-16 ENCOUNTER — Ambulatory Visit: Payer: BC Managed Care – PPO | Admitting: Sports Medicine

## 2022-10-17 ENCOUNTER — Ambulatory Visit (INDEPENDENT_AMBULATORY_CARE_PROVIDER_SITE_OTHER): Payer: BC Managed Care – PPO

## 2022-10-17 ENCOUNTER — Ambulatory Visit (INDEPENDENT_AMBULATORY_CARE_PROVIDER_SITE_OTHER): Payer: BC Managed Care – PPO | Admitting: Sports Medicine

## 2022-10-17 DIAGNOSIS — M19011 Primary osteoarthritis, right shoulder: Secondary | ICD-10-CM | POA: Diagnosis not present

## 2022-10-17 DIAGNOSIS — G8929 Other chronic pain: Secondary | ICD-10-CM

## 2022-10-17 DIAGNOSIS — M25511 Pain in right shoulder: Secondary | ICD-10-CM

## 2022-10-17 NOTE — Progress Notes (Signed)
    Procedures performed today:    Procedure: Real-time Ultrasound Guided gadolinium contrast injection of right glenohumeral joint Device: Samsung HS60  Verbal informed consent obtained.  Time-out conducted.  Noted no overlying erythema, induration, or other signs of local infection.  Skin prepped in a sterile fashion.  Local anesthesia: Topical Ethyl chloride.  With sterile technique and under real time ultrasound guidance: Noted normal-appearing joint, 22-gauge spinal needle advanced into the joint from a posterior approach, injected 1 cc kenalog 40, 2 cc lidocaine, 2 cc bupivacaine, syringe switched and 0.1 cc gadolinium injected, syringe again switched and then 10 cc sterile saline used to fully distend the joint. Joint visualized and capsule seen distending confirming intra-articular placement of contrast material and medication. Completed without difficulty  Advised to call if fevers/chills, erythema, induration, drainage, or persistent bleeding.  Images permanently stored in PACS Impression: Technically successful ultrasound guided gadolinium contrast injection for MR arthrography.  Please see separate MR arthrogram report.  Independent interpretation of notes and tests performed by another provider:   None.  Brief History, Exam, Impression, and Recommendations:    Chronic right shoulder pain Grant Gilbert returns, he is a very pleasant 42 year old male, lifts a lot of weights, he has chronic right shoulder pain, he was seen in 2021, we also saw him in November 2023, he was given home physical therapy, physician directed.  Exam at the time was more consistent with labral injury. Positive O'Brien's test, crank test. Unfortunately he has not improved and spite of continued conservative treatment. X-rays were unrevealing, for this reason we will proceed with MR arthrography, I will see him back for the ultrasound-guided glenohumeral gadolinium injection. Refilling meloxicam.  Update: Today  MRI arthrography injection performed.  Further management will be based on MRI results.    ____________________________________________ Grant Gilbert. Benjamin Stain, M.D., ABFM., CAQSM., AME. Primary Care and Sports Medicine [facility]  Adjunct Professor of Family Medicine  [employer] [employer] [facility] [employer]  Restaurant manager, fast food

## 2022-10-17 NOTE — Assessment & Plan Note (Signed)
Grant Gilbert returns, he is a very pleasant 42 year old male, lifts a lot of weights, he has chronic right shoulder pain, he was seen in 2021, we also saw him in November 2023, he was given home physical therapy, physician directed.  Exam at the time was more consistent with labral injury. Positive O'Brien's test, crank test. Unfortunately he has not improved and spite of continued conservative treatment. X-rays were unrevealing, for this reason we will proceed with MR arthrography, I will see him back for the ultrasound-guided glenohumeral gadolinium injection. Refilling meloxicam.  Update: Today MRI arthrography injection performed.  Further management will be based on MRI results.

## 2022-11-30 ENCOUNTER — Telehealth: Payer: Self-pay

## 2022-11-30 ENCOUNTER — Ambulatory Visit (INDEPENDENT_AMBULATORY_CARE_PROVIDER_SITE_OTHER): Payer: BC Managed Care – PPO

## 2022-11-30 ENCOUNTER — Ambulatory Visit
Admission: EM | Admit: 2022-11-30 | Discharge: 2022-11-30 | Disposition: A | Discharge status: Home / Self Care | Payer: BC Managed Care – PPO | Attending: Family Medicine | Admitting: Family Medicine

## 2022-11-30 DIAGNOSIS — R0782 Intercostal pain: Secondary | ICD-10-CM | POA: Diagnosis not present

## 2022-11-30 DIAGNOSIS — R0781 Pleurodynia: Secondary | ICD-10-CM | POA: Diagnosis not present

## 2022-11-30 NOTE — ED Triage Notes (Signed)
Pt is here for R-side rib pain. Pt thinks he injured his ribs 5 days ago while he was exercising. Pt is taking meloxicam for pain.

## 2022-11-30 NOTE — Discharge Instructions (Signed)
Continue the meloxicam daily May use ice or heat See Dr T if it fails to improve over time

## 2022-11-30 NOTE — ED Provider Notes (Signed)
[name]    CSN: [id number] Arrival date & time: 11/30/22  1127      History   Chief Complaint Chief Complaint  Patient presents with   Rib Injury    HPI Grant Gilbert is a 42 y.o. male.   HPI  Patient was exercising at a gym doing an inverted leg press when he felt and heard a loud pop with pain in his right ribs.this happened 5 d ago.  He has recuded his activity and is taking meloxicam.  Still has pain with deep breath and movement, and area is very tender to touch. Is in good health Gets regular exercise  History reviewed. No pertinent past medical history.  Patient Active Problem List   Diagnosis Date Noted   Plantar fasciitis, right 04/07/2022   Abdominal wall strain 12/10/2019   Contusion, buttock, left 08/08/2019   Chronic right shoulder pain 08/08/2019    Past Surgical History:  Procedure Laterality Date   INNER EAR SURGERY     at 42 y.o   TYMPANOSTOMY TUBE PLACEMENT Left        Home Medications    Prior to Admission medications   Medication Sig Start Date End Date Taking? Authorizing Provider  meloxicam (MOBIC) 15 MG tablet One tab PO every 24 hours with a meal for 2 weeks, then once every 24 hours prn pain. 10/06/22  Yes Monica Becton, MD  PRESCRIPTION MEDICATION Rx cream to palms of hands, unknown name   Yes [provider]    Family History Family History  Problem Relation Age of Onset   Healthy Mother    Parkinson's disease Father 30    Social History Social History   Tobacco Use   Smoking status: Former    Packs/day: .5    Types: Cigarettes    Quit date: 06/01/2018    Years since quitting: 4.5    Passive exposure: Never   Smokeless tobacco: Never  Vaping Use   Vaping Use: Never used  Substance Use Topics   Alcohol use: Not Currently    Comment: occasionally   Drug use: No     Allergies   Patient has no known allergies.   Review of Systems Review of Systems See HPI  Physical Exam Triage  Vital Signs ED Triage Vitals  Enc Vitals Group     BP 11/30/22 1138 113/87     Pulse Rate 11/30/22 1138 78     Resp 11/30/22 1138 18     Temp 11/30/22 1138 98.6 F (37 C)     Temp Source 11/30/22 1138 Oral     SpO2 11/30/22 1138 98 %     Weight --      Height --      Head Circumference --      Peak Flow --      Pain Score 11/30/22 1141 5     Pain Loc --      Pain Edu? --      Excl. in [place]? --    No data found.  Updated Vital Signs BP 113/87 (BP Location: Left Arm)   Pulse 78   Temp 98.6 F (37 C) (Oral)   Resp 18   SpO2 98%      Physical Exam Constitutional:      General: He is not in acute distress.    Appearance: He is well-developed and normal weight. He is not ill-appearing.  HENT:     Head: Normocephalic and atraumatic.  Eyes:  Conjunctiva/sclera: Conjunctivae normal.     Pupils: Pupils are equal, round, and reactive to light.  Cardiovascular:     Rate and Rhythm: Normal rate and regular rhythm.     Heart sounds: Normal heart sounds.  Pulmonary:     Effort: Pulmonary effort is normal. No respiratory distress.     Breath sounds: Normal breath sounds.     Comments: Tenderness in ant axillary line at rib 9-10.  No deformity or crepitus Chest:     Chest wall: Tenderness present.  Abdominal:     General: There is no distension.     Palpations: Abdomen is soft.  Musculoskeletal:        General: Normal range of motion.     Cervical back: Normal range of motion.  Skin:    General: Skin is warm and dry.  Neurological:     General: No focal deficit present.     Mental Status: He is alert.     Gait: Gait normal.  Psychiatric:        Mood and Affect: Mood normal.      UC Treatments / Results  Labs (all labs ordered are listed, but only abnormal results are displayed) Labs Reviewed - No data to display  EKG   Radiology No results found.  Procedures Procedures (including critical care time)  Medications Ordered in UC Medications - No data to  display  Initial Impression / Assessment and Plan / UC Course  I have reviewed the triage vital signs and the nursing notes.  Pertinent labs & imaging results that were available during my care of the patient were reviewed by me and considered in my medical decision making (see chart for details).     I discharged patient pending x ray final reading.  To my inspection the rib films are negative for fracture.  Will call patient if the reading indicates change in therapy Final Clinical Impressions(s) / UC Diagnoses   Final diagnoses:  Rib pain on right side     Discharge Instructions      Continue the meloxicam daily May use ice or heat See Dr T if it fails to improve over time     ED Prescriptions   None    PDMP not reviewed this encounter.   Eustace Moore, MD 11/30/22 [phone]

## 2023-05-23 ENCOUNTER — Other Ambulatory Visit: Payer: Self-pay | Admitting: Sports Medicine

## 2023-05-23 DIAGNOSIS — G8929 Other chronic pain: Secondary | ICD-10-CM

## 2023-08-23 DIAGNOSIS — L814 Other melanin hyperpigmentation: Secondary | ICD-10-CM | POA: Diagnosis not present

## 2023-08-23 DIAGNOSIS — L578 Other skin changes due to chronic exposure to nonionizing radiation: Secondary | ICD-10-CM | POA: Diagnosis not present

## 2023-08-23 DIAGNOSIS — D225 Melanocytic nevi of trunk: Secondary | ICD-10-CM | POA: Diagnosis not present

## 2023-08-23 DIAGNOSIS — I781 Nevus, non-neoplastic: Secondary | ICD-10-CM | POA: Diagnosis not present

## 2023-11-14 ENCOUNTER — Other Ambulatory Visit: Payer: Self-pay | Admitting: Sports Medicine

## 2023-11-14 DIAGNOSIS — G8929 Other chronic pain: Secondary | ICD-10-CM

## 2023-12-21 ENCOUNTER — Other Ambulatory Visit: Payer: Self-pay | Admitting: Sports Medicine

## 2023-12-21 DIAGNOSIS — G8929 Other chronic pain: Secondary | ICD-10-CM

## 2024-02-05 ENCOUNTER — Encounter: Payer: Self-pay | Admitting: Sports Medicine
